# Patient Record
Sex: Female | Born: 1995 | Race: Black or African American | Hispanic: No | Marital: Single | Smoking: Never smoker
Health system: Southern US, Community
[De-identification: ages and names within clinical notes are randomized; demographics above are authoritative.]

## PROBLEM LIST (undated history)

## (undated) DIAGNOSIS — G5600 Carpal tunnel syndrome, unspecified upper limb: Secondary | ICD-10-CM

## (undated) DIAGNOSIS — J45909 Unspecified asthma, uncomplicated: Secondary | ICD-10-CM

## (undated) HISTORY — PX: WISDOM TOOTH EXTRACTION: SHX21

---
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
## 2017-08-07 ENCOUNTER — Emergency Department (HOSPITAL_COMMUNITY)
Admission: EM | Admit: 2017-08-07 | Discharge: 2017-08-08 | Disposition: A | Discharge status: Other Acute Inpatient Hospital | Payer: BLUE CROSS/BLUE SHIELD | Attending: Emergency Medicine | Admitting: Emergency Medicine

## 2017-08-07 ENCOUNTER — Other Ambulatory Visit: Payer: Self-pay

## 2017-08-07 DIAGNOSIS — J45909 Unspecified asthma, uncomplicated: Secondary | ICD-10-CM | POA: Insufficient documentation

## 2017-08-07 DIAGNOSIS — R45851 Suicidal ideations: Secondary | ICD-10-CM | POA: Insufficient documentation

## 2017-08-07 DIAGNOSIS — F322 Major depressive disorder, single episode, severe without psychotic features: Secondary | ICD-10-CM | POA: Diagnosis present

## 2017-08-07 LAB — COMPREHENSIVE METABOLIC PANEL
ALT: 109 U/L — ABNORMAL HIGH (ref 14–54)
ALT: 98 U/L — ABNORMAL HIGH (ref 14–54)
AST: 56 U/L — ABNORMAL HIGH (ref 15–41)
AST: 48 U/L — ABNORMAL HIGH (ref 15–41)
Albumin: 3.8 g/dL (ref 3.5–5.0)
Albumin: 3.4 g/dL — ABNORMAL LOW (ref 3.5–5.0)
Alkaline Phosphatase: 83 U/L (ref 38–126)
Alkaline Phosphatase: 75 U/L (ref 38–126)
Anion gap: 8 (ref 5–15)
Anion gap: 7 (ref 5–15)
BUN: 9 mg/dL (ref 6–20)
BUN: 8 mg/dL (ref 6–20)
CO2: 26 mmol/L (ref 22–32)
CO2: 25 mmol/L (ref 22–32)
Calcium: 9.1 mg/dL (ref 8.9–10.3)
Calcium: 8.8 mg/dL — ABNORMAL LOW (ref 8.9–10.3)
Chloride: 105 mmol/L (ref 101–111)
Chloride: 105 mmol/L (ref 101–111)
Creatinine, Ser: 0.79 mg/dL (ref 0.44–1.00)
Creatinine, Ser: 0.65 mg/dL (ref 0.44–1.00)
GFR calc Af Amer: >60 mL/min (ref >60)
GFR calc Af Amer: >60 mL/min (ref >60)
GFR calc non Af Amer: >60 mL/min (ref >60)
GFR calc non Af Amer: >60 mL/min (ref >60)
Glucose, Bld: 210 mg/dL — ABNORMAL HIGH (ref 65–99)
Glucose, Bld: 161 mg/dL — ABNORMAL HIGH (ref 65–99)
Potassium: 3.8 mmol/L (ref 3.5–5.1)
Potassium: 3.8 mmol/L (ref 3.5–5.1)
Sodium: 139 mmol/L (ref 135–145)
Sodium: 137 mmol/L (ref 135–145)
Total Bilirubin: 0.6 mg/dL (ref 0.3–1.2)
Total Bilirubin: 0.9 mg/dL (ref 0.3–1.2)
Total Protein: 7.4 g/dL (ref 6.5–8.1)
Total Protein: 6.9 g/dL (ref 6.5–8.1)

## 2017-08-07 LAB — RAPID URINE DRUG SCREEN, HOSP PERFORMED
Amphetamines: NOT DETECTED
Barbiturates: NOT DETECTED
Benzodiazepines: NOT DETECTED
Cocaine: NOT DETECTED
Opiates: NOT DETECTED
Tetrahydrocannabinol: NOT DETECTED

## 2017-08-07 LAB — CBC WITH DIFFERENTIAL/PLATELET
Basophils Absolute: 0 10*3/uL (ref 0.0–0.1)
Basophils Relative: 0 %
Eosinophils Absolute: 0.1 10*3/uL (ref 0.0–0.7)
Eosinophils Relative: 2 %
HCT: 40.8 % (ref 36.0–46.0)
Hemoglobin: 13.9 g/dL (ref 12.0–15.0)
Lymphocytes Relative: 31 %
Lymphs Abs: 1.5 10*3/uL (ref 0.7–4.0)
MCH: 30.3 pg (ref 26.0–34.0)
MCHC: 34.1 g/dL (ref 30.0–36.0)
MCV: 88.9 fL (ref 78.0–100.0)
Monocytes Absolute: 0.4 10*3/uL (ref 0.1–1.0)
Monocytes Relative: 9 %
Neutro Abs: 2.8 10*3/uL (ref 1.7–7.7)
Neutrophils Relative %: 58 %
Platelets: 229 10*3/uL (ref 150–400)
RBC: 4.59 MIL/uL (ref 3.87–5.11)
RDW: 12.6 % (ref 11.5–15.5)
WBC: 4.8 10*3/uL (ref 4.0–10.5)

## 2017-08-07 LAB — I-STAT BETA HCG BLOOD, ED (MC, WL, AP ONLY): I-stat hCG, quantitative: <5 m[IU]/mL (ref <5)

## 2017-08-07 LAB — SALICYLATE LEVEL
Salicylate Lvl: <7 mg/dL (ref 2.8–30.0)
Salicylate Lvl: <7 mg/dL (ref 2.8–30.0)

## 2017-08-07 LAB — ACETAMINOPHEN LEVEL
Acetaminophen (Tylenol), Serum: <10 ug/mL — ABNORMAL LOW (ref 10–30)
Acetaminophen (Tylenol), Serum: <10 ug/mL — ABNORMAL LOW (ref 10–30)

## 2017-08-07 LAB — ETHANOL: Alcohol, Ethyl (B): <10 mg/dL (ref <10)

## 2017-08-07 NOTE — ED Notes (Signed)
Spoke with poison control, recommend continuous cardiac monitoring; repeat salicyte tylenol and electrolytes at 1830. Looking for a 4 hour post ingestion level. Monitor LFT's. Treatment will depend on what the levels come back as. They will call back later and check on the levels.

## 2017-08-07 NOTE — ED Notes (Signed)
Pt states she does not have to urinate still.

## 2017-08-07 NOTE — ED Notes (Signed)
Bed: RESA Expected date:  Expected time:  Means of arrival:  Comments: Aspirin overdose

## 2017-08-07 NOTE — ED Triage Notes (Signed)
Per EMS, patient come from home. Pt states she ingested a bottle of aspirin. There was a small bottle of 81mg , 36 count. When [employer] arrived patient stated she wanted to die and was hysterically crying in route to hospital. Pt is alert and oriented x3. VSS. Pt is adopted.

## 2017-08-07 NOTE — BH Assessment (Addendum)
Assessment Note  Sandy Smith is an 21 y.o. female that presents voluntary this date after attempting to harm herself by overdosing on aspirin. Patient speaks in a low soft voice and is very tearful during the assessment. Patient is drowsy and renders limited history and is vague in reference to the details of the incident that occurred earlier this date. Patient does admit to active S/I stating she was trying to "take her life" earlier this date by overdosing on aspirin. Patient reports the incident was due to ongoing conflict with her mother. Patient states she currently has a two year old child and reports ongoing financial issues. Patient stated she attempted to use her mother's credit card earlier this date but would not elaborate on the details of that incident. Patient stated it "made her mother so mad that she was going to charge her." Patient is unsure if legal charges have been filed at this time. Patient stated she was so overwhelmed by her mother's reactions that she attempted to overdose on aspirin. Patient is unsure how many pills she ingested but stated "it was the whole bottle." Patient denies any previous attempts/gestures at self harm or any history of MH disorder/s. Patient denies any SA history, H/I or AVH. Patient is time/place oriented and is very tearful as this Probation officer attempts to complete assessment. Per record review patient has a limited [place] history and reports she has never been diagnosed with any disorder although she feels she has been suffering from depression "for some time." Patient reports ongoing symptoms to include: excessive sadness, guilt and fatigue. Patient states she has never been prescribed any psychotropic medications in the past but is open to medication interventions at this time to assist with symptoms. Per note review, patient [facility] [address] by EMS after ingesting a bottle of aspirin. Per EMS there was a small bottle of 43m, 36 count. When [employer] arrived patient  stated she wanted to die and was hysterically crying in route to hospital. Case was staffed with LReita ClicheDNP who reported patient met criteria for a inpatient admission to assist with stabilization as appropriate bed placement is investigated once cleared by poison control.    Diagnosis: F33.2  MDD recurrent severe without psychotic features.  Past Medical History:  Past Medical History:  Diagnosis Date  . Asthma   . Carpal tunnel syndrome     Past Surgical History:  Procedure Laterality Date  . WISDOM TOOTH EXTRACTION      Family History: No family history on file.  Social History:  reports that  has never smoked. She does not have any smokeless tobacco history on file. She reports that she does not drink alcohol or use drugs.  Additional Social History:  Alcohol / Drug Use Pain Medications: See MAR Prescriptions: See MAR Over the Counter: See MAR History of alcohol / drug use?: No history of alcohol / drug abuse Longest period of sobriety (when/how long): Denies Negative Consequences of Use: (NA) Withdrawal Symptoms: (NA)  CIWA: CIWA-Ar BP: 129/85 Pulse Rate: 77 COWS:    Allergies:  Allergies  Allergen Reactions  . Shellfish Allergy Other (See Comments)    Unknown reaction: allergy discovered by allergy testing     Home Medications:  (Not in a hospital admission)  OB/GYN Status:  Patient's last menstrual period was 08/07/2017.  General Assessment Data [facility] of Assessment: [facility] TTS Assessment: In system Is this a Tele or Face-to-Face Assessment?: Face-to-Face Is this an Initial Assessment or a Re-assessment for this encounter?: Initial  Assessment Marital status: Single Maiden name: NA Is patient pregnant?: No Pregnancy Status: No Living Arrangements: Parent Can pt return to current living arrangement?: Yes Admission Status: Voluntary Is patient capable of signing voluntary admission?: Yes Referral Source: Self/Family/Friend Insurance type: Self  Pay  Medical Screening Exam ([place]) Medical Exam completed: Yes  Crisis Care Plan Living Arrangements: Parent Legal Guardian: (NA) Name of Psychiatrist: None Name of Therapist: None  Education Status Is patient currently in school?: Yes Current Grade: (2nd year college) Highest grade of school patient has completed: (1st year) Name of school: [employer], drilling person: NA  Risk to self with the past 6 months Suicidal Ideation: Yes-Currently Present Has patient been a risk to self within the past 6 months prior to admission? : No Suicidal Intent: Yes-Currently Present Has patient had any suicidal intent within the past 6 months prior to admission? : No Is patient at risk for suicide?: Yes Suicidal Plan?: Yes-Currently Present Has patient had any suicidal plan within the past 6 months prior to admission? : No Specify Current Suicidal Plan: Pt to overdose on aspirin Access to Means: Yes Specify Access to Suicidal Means: Pt had aspirin What has been your use of drugs/alcohol within the last 12 months?: Denies current use Previous Attempts/Gestures: No How many times?: 0 Other Self Harm Risks: (NA) Triggers for Past Attempts: Unknown Intentional Self Injurious Behavior: None Family Suicide History: No Recent stressful life event(s): Other (Comment)(Family issues) Persecutory voices/beliefs?: No Depression: Yes Depression Symptoms: Guilt, Feeling worthless/self pity Substance abuse history and/or treatment for substance abuse?: No Suicide prevention information given to non-admitted patients: Not applicable  Risk to Others within the past 6 months Homicidal Ideation: No Does patient have any lifetime risk of violence toward others beyond the six months prior to admission? : No Thoughts of Harm to Others: No Current Homicidal Intent: No Current Homicidal Plan: No Access to Homicidal Means: No Identified Victim: NA History of harm to others?:  No Assessment of Violence: None Noted Violent Behavior Description: NA Does patient have access to weapons?: No Criminal Charges Pending?: No Does patient have a court date: No Is patient on probation?: No  Psychosis Hallucinations: None noted Delusions: None noted  Mental Status Report Appearance/Hygiene: In scrubs Eye Contact: Good Motor Activity: Freedom of movement Speech: Soft, Slow Level of Consciousness: Drowsy Mood: Depressed Affect: Fearful Anxiety Level: Moderate Thought Processes: Coherent, Relevant Judgement: Unimpaired Orientation: Person, Place, Time Obsessive Compulsive Thoughts/Behaviors: None  Cognitive Functioning Concentration: Good Memory: Recent Intact IQ: Average Insight: Fair Impulse Control: Poor Appetite: Fair Weight Loss: 0 Weight Gain: 0 Sleep: Decreased Total Hours of Sleep: 5 Vegetative Symptoms: None  ADLScreening [facility] Assessment Services) Patient's cognitive ability adequate to safely complete daily activities?: Yes Patient able to express need for assistance with ADLs?: Yes Independently performs ADLs?: Yes (appropriate for developmental age)  Prior Inpatient Therapy Prior Inpatient Therapy: No Prior Therapy Dates: NA Prior Therapy Facilty/Provider(s): NA Reason for Treatment: NA  Prior Outpatient Therapy Prior Outpatient Therapy: No Prior Therapy Dates: NA Prior Therapy Facilty/Provider(s): NA Reason for Treatment: NA Does patient have an ACCT team?: No Does patient have Intensive In-House Services?  : No Does patient have [facility] services? : No Does patient have P4CC services?: No  ADL Screening (condition at time of admission) Patient's cognitive ability adequate to safely complete daily activities?: Yes Is the patient deaf or have difficulty hearing?: No Does the patient have difficulty seeing, even when wearing glasses/contacts?: No Does the patient have difficulty  concentrating, remembering, or making decisions?:  No Patient able to express need for assistance with ADLs?: Yes Does the patient have difficulty dressing or bathing?: No Independently performs ADLs?: Yes (appropriate for developmental age) Does the patient have difficulty walking or climbing stairs?: No Weakness of Legs: None Weakness of Arms/Hands: None  Home Assistive Devices/Equipment Home Assistive Devices/Equipment: None  Therapy Consults (therapy consults require a physician order) PT Evaluation Needed: No OT Evalulation Needed: No SLP Evaluation Needed: No Abuse/Neglect Assessment (Assessment to be complete while patient is alone) Physical Abuse: Denies Verbal Abuse: Denies Sexual Abuse: Denies Exploitation of patient/patient's resources: Denies Self-Neglect: Denies Values / Beliefs Cultural Requests During Hospitalization: None Spiritual Requests During Hospitalization: None Consults Spiritual Care Consult Needed: No Social Work Consult Needed: No Regulatory affairs officer (For Healthcare) Does Patient Have a Medical Advance Directive?: No Would patient like information on creating a medical advance directive?: No - Patient declined    Additional Information 1:1 In Past 12 Months?: No CIRT Risk: No Elopement Risk: No Does patient have medical clearance?: Yes     Disposition: Case was staffed with Reita Cliche DNP who reported patient met criteria for a inpatient admission to assist with stabilization as appropriate bed placement is investigated.   Disposition Initial Assessment Completed for this Encounter: Yes Disposition of Patient: Inpatient treatment program Type of inpatient treatment program: Adult Other disposition(s): Other (Comment)(Inpatient )  On Site Evaluation by:   Reviewed with Physician:    Mamie Nick 08/07/2017 6:25 PM

## 2017-08-07 NOTE — ED Notes (Signed)
SBAR Report received from previous nurse. Pt received calm and visible on unit. Pt denies current SI/ HI, A/V H,  anxiety, or pain at this time, and appears otherwise stable and free of distress pt endorses depression 10/10 but contracts for safety.. Pt reminded of camera surveillance, q 15 min rounds, and rules of the milieu. Will continue to assess. Sandy Smith from poison Control called and pt file has been closed on their end.

## 2017-08-07 NOTE — ED Provider Notes (Signed)
7:40 PM Assumed care from Dr. Freida BusmanAllen, please see their note for full history, physical and decision making until this point. In brief this is a 21 y.o. year old female who presented to the ED tonight with Ingestion (SI)     Needs TTS consult. Pending 6 hour salicylate for medical clearance.   Poison control contacted and stated that labs needs repeat in 630.  These labs had improved liver enzymes and not worsening.  Still negative salicylate and Tylenol level.  Patient is medically cleared at this time for TTS recommendations and evaluation  Labs, studies and imaging reviewed by myself and considered in medical decision making if ordered. Imaging interpreted by radiology.  Labs Reviewed  ACETAMINOPHEN LEVEL - Abnormal; Notable for the following components:      Result Value   Acetaminophen (Tylenol), Serum <10 (*)    All other components within normal limits  COMPREHENSIVE METABOLIC PANEL - Abnormal; Notable for the following components:   Glucose, Bld 210 (*)    AST 56 (*)    ALT 109 (*)    All other components within normal limits  ACETAMINOPHEN LEVEL - Abnormal; Notable for the following components:   Acetaminophen (Tylenol), Serum <10 (*)    All other components within normal limits  COMPREHENSIVE METABOLIC PANEL - Abnormal; Notable for the following components:   Glucose, Bld 161 (*)    Calcium 8.8 (*)    Albumin 3.4 (*)    AST 48 (*)    ALT 98 (*)    All other components within normal limits  ETHANOL  RAPID URINE DRUG SCREEN, HOSP PERFORMED  SALICYLATE LEVEL  CBC WITH DIFFERENTIAL/PLATELET  SALICYLATE LEVEL  I-STAT BETA HCG BLOOD, ED ([facility], [facility], [facility] ONLY)    No orders to display    No Follow-up on file.    Marily MemosMesner, Jr [place], MD 08/07/17 [phone]

## 2017-08-07 NOTE — ED Provider Notes (Addendum)
[facility]-EMERGENCY DEPT Provider Note   CSN: [id number] Arrival date & time: 08/07/17  1501     History   Chief Complaint Chief Complaint  Patient presents with  . Ingestion    SI    HPI Sandy Smith is a 21 y.o. female.  21 year old female here after intentional ingestion of salicylate just prior to arrival.  States that she took a bottle of aspirin sometime through the day.  She will say exactly when.  There was approximately 36 tablets.  She denies any emesis.  No abdominal pain or tinnitus.  States that she did this because her mother found out that she was using her credit card.  Denies responding to internal stimuli.  No prior history of suicide attempt before in the past.  Is not being treated for any current psychiatric illness.  Denies any recent use of alcohol tobacco      Past Medical History:  Diagnosis Date  . Asthma   . Carpal tunnel syndrome     There are no active problems to display for this patient.   Past Surgical History:  Procedure Laterality Date  . WISDOM TOOTH EXTRACTION      OB History    Gravida Para Term Preterm AB Living   1 1 1     1    SAB TAB Ectopic Multiple Live Births         0 1       Home Medications    Prior to Admission medications   Medication Sig Start Date End Date Taking? Authorizing Provider  ibuprofen (ADVIL,MOTRIN) 600 MG tablet Take 1 tablet (600 mg total) by mouth every 6 (six) hours. Patient not taking: Reported on 05/28/2015 05/04/15   Aviva SignsWilliams, Marie L, CNM    Family History No family history on file.  Social History Social History   Tobacco Use  . Smoking status: Never Smoker  Substance Use Topics  . Alcohol use: No  . Drug use: No     Allergies   Shellfish allergy   Review of Systems Review of Systems  All other systems reviewed and are negative.    Physical Exam Updated Vital Signs BP (!) 145/106 (BP Location: Right Arm)   Pulse (!) 103   Temp (!) 97.4 F  (36.3 C) (Oral)   Resp 18   Ht 1.676 m (5\' 6" )   Wt 127 kg (280 lb)   LMP 08/07/2017 Comment: Pt is on birth control; implant in L arm  SpO2 100%   BMI 45.19 kg/m   Physical Exam  Constitutional: She is oriented to person, place, and time. She appears well-developed and well-nourished.  Non-toxic appearance. No distress.  HENT:  Head: Normocephalic and atraumatic.  Eyes: Conjunctivae, EOM and lids are normal. Pupils are equal, round, and reactive to light.  Neck: Normal range of motion. Neck supple. No tracheal deviation present. No thyroid mass present.  Cardiovascular: Normal rate, regular rhythm and normal heart sounds. Exam reveals no gallop.  No murmur heard. Pulmonary/Chest: Effort normal and breath sounds normal. No stridor. No respiratory distress. She has no decreased breath sounds. She has no wheezes. She has no rhonchi. She has no rales.  Abdominal: Soft. Normal appearance and bowel sounds are normal. She exhibits no distension. There is no tenderness. There is no rebound and no CVA tenderness.  Musculoskeletal: Normal range of motion. She exhibits no edema or tenderness.  Neurological: She is alert and oriented to person, place, and time. She has normal  strength. No cranial nerve deficit or sensory deficit. GCS eye subscore is 4. GCS verbal subscore is 5. GCS motor subscore is 6.  Skin: Skin is warm and dry. No abrasion and no rash noted.  Psychiatric: Her affect is blunt. Her speech is delayed. She is withdrawn. She expresses impulsivity. She expresses suicidal ideation. She expresses suicidal plans. She expresses no homicidal plans.  Nursing note and vitals reviewed.    ED Treatments / Results  Labs (all labs ordered are listed, but only abnormal results are displayed) Labs Reviewed  ETHANOL  RAPID URINE DRUG SCREEN, HOSP PERFORMED  SALICYLATE LEVEL  ACETAMINOPHEN LEVEL  CBC WITH DIFFERENTIAL/PLATELET  COMPREHENSIVE METABOLIC PANEL  I-STAT BETA HCG BLOOD, ED ([facility],  [facility], [facility] ONLY)    EKG  EKG Interpretation None       Radiology No results found.  Procedures Procedures (including critical care time)  Medications Ordered in ED Medications - No data to display   Initial Impression / Assessment and Plan / ED Course  I have reviewed the triage vital signs and the nursing notes.  Pertinent labs & imaging results that were available during my care of the patient were reviewed by me and considered in my medical decision making (see chart for details).    Dr. Clayborne DanaMesner to follow-up on patient's 6-hour salicylate level Patient will require a 6-hour salicylate level and then evaluation by behavior health services  Final Clinical Impressions(s) / ED Diagnoses   Final diagnoses:  None    ED Discharge Orders    None       Lorre NickAllen, Kahley Leib, MD 08/07/17 1532    Lorre NickAllen, Jasdeep Kepner, MD 08/07/17 1534    Lorre NickAllen, Nyazia Canevari, MD 08/07/17 1721

## 2017-08-07 NOTE — BH Assessment (Addendum)
[place] Assessment Progress Note Case was staffed with Reita Cliche DNP who reported patient met criteria for a inpatient admission to assist with stabilization as appropriate bed placement is investigated once cleared by poison control.

## 2017-08-07 NOTE — ED Notes (Signed)
TTS at bedside. 

## 2017-08-08 ENCOUNTER — Inpatient Hospital Stay (HOSPITAL_COMMUNITY)
Admission: AD | Admit: 2017-08-08 | Discharge: 2017-08-12 | DRG: 885 | Disposition: A | Discharge status: Home / Self Care | Payer: Federal, State, Local not specified - Other | Source: Intra-hospital | Attending: Psychiatry | Admitting: Psychiatry

## 2017-08-08 ENCOUNTER — Encounter (HOSPITAL_COMMUNITY): Payer: Self-pay | Admitting: *Deleted

## 2017-08-08 ENCOUNTER — Other Ambulatory Visit: Payer: Self-pay

## 2017-08-08 DIAGNOSIS — Z7982 Long term (current) use of aspirin: Secondary | ICD-10-CM

## 2017-08-08 DIAGNOSIS — G4700 Insomnia, unspecified: Secondary | ICD-10-CM | POA: Diagnosis present

## 2017-08-08 DIAGNOSIS — J45909 Unspecified asthma, uncomplicated: Secondary | ICD-10-CM | POA: Diagnosis present

## 2017-08-08 DIAGNOSIS — R110 Nausea: Secondary | ICD-10-CM | POA: Diagnosis not present

## 2017-08-08 DIAGNOSIS — F332 Major depressive disorder, recurrent severe without psychotic features: Principal | ICD-10-CM | POA: Diagnosis present

## 2017-08-08 DIAGNOSIS — Z813 Family history of other psychoactive substance abuse and dependence: Secondary | ICD-10-CM | POA: Diagnosis not present

## 2017-08-08 DIAGNOSIS — F322 Major depressive disorder, single episode, severe without psychotic features: Secondary | ICD-10-CM | POA: Diagnosis present

## 2017-08-08 DIAGNOSIS — Z79899 Other long term (current) drug therapy: Secondary | ICD-10-CM

## 2017-08-08 DIAGNOSIS — Z915 Personal history of self-harm: Secondary | ICD-10-CM | POA: Diagnosis not present

## 2017-08-08 DIAGNOSIS — R45851 Suicidal ideations: Secondary | ICD-10-CM | POA: Diagnosis present

## 2017-08-08 DIAGNOSIS — F419 Anxiety disorder, unspecified: Secondary | ICD-10-CM | POA: Diagnosis present

## 2017-08-08 DIAGNOSIS — Z23 Encounter for immunization: Secondary | ICD-10-CM

## 2017-08-08 DIAGNOSIS — F1420 Cocaine dependence, uncomplicated: Secondary | ICD-10-CM | POA: Diagnosis not present

## 2017-08-08 DIAGNOSIS — F39 Unspecified mood [affective] disorder: Secondary | ICD-10-CM | POA: Diagnosis not present

## 2017-08-08 MED ORDER — ACETAMINOPHEN 325 MG PO TABS: 650.0000 mg | ORAL_TABLET | Freq: Four times a day (QID) | ORAL | Status: DC | PRN

## 2017-08-08 MED ORDER — MAGNESIUM HYDROXIDE 400 MG/5ML PO SUSP: 30.0000 mL | Freq: Every day | ORAL | Status: DC | PRN

## 2017-08-08 MED ORDER — TRAZODONE HCL 50 MG PO TABS
50.0000 mg | ORAL_TABLET | Freq: Every evening | ORAL | Status: DC | PRN
Start: 2017-08-08 — End: 2017-08-12
  Administered 2017-08-10 – 2017-08-11 (×2): 50 mg via ORAL
  Filled 2017-08-08 (×2): qty 1
  Filled 2017-08-08: qty 7

## 2017-08-08 MED ORDER — INFLUENZA VAC SPLIT QUAD 0.5 ML IM SUSY
0.5000 mL | PREFILLED_SYRINGE | INTRAMUSCULAR | Status: AC
  Administered 2017-08-10: 0.5 mL via INTRAMUSCULAR
  Filled 2017-08-08: qty 0.5

## 2017-08-08 MED ORDER — HYDROXYZINE HCL 25 MG PO TABS
25.0000 mg | ORAL_TABLET | Freq: Three times a day (TID) | ORAL | Status: DC | PRN
  Administered 2017-08-10 – 2017-08-11 (×2): 25 mg via ORAL
  Filled 2017-08-08 (×2): qty 1
  Filled 2017-08-08: qty 10

## 2017-08-08 MED ORDER — ALUM & MAG HYDROXIDE-SIMETH 200-200-20 MG/5ML PO SUSP: 30.0000 mL | ORAL | Status: DC | PRN

## 2017-08-08 NOTE — Tx Team (Signed)
Initial Treatment Plan 08/08/2017 2:17 PM Sandy Smith [id number]    PATIENT STRESSORS: Educational concerns Financial difficulties Marital or family conflict   PATIENT STRENGTHS: Ability for insight Average or above average intelligence Capable of independent living Communication skills General fund of knowledge Motivation for treatment/growth   PATIENT IDENTIFIED PROBLEMS: Depression Suicidal thoughts "I'm never really happy" "Mostly I feel depressed a lot"                     DISCHARGE CRITERIA:  Ability to meet basic life and health needs Improved stabilization in mood, thinking, and/or behavior Verbal commitment to aftercare and medication compliance  PRELIMINARY DISCHARGE PLAN: Attend aftercare/continuing care group  PATIENT/FAMILY INVOLVEMENT: This treatment plan has been presented to and reviewed with the patient, Sandy Smith, and/or family member, .  The patient and family have been given the opportunity to ask questions and make suggestions.  Sandy Smith, Sandy Smith, [place]RN 08/08/2017, 2:17 PM

## 2017-08-08 NOTE — Progress Notes (Signed)
Sandy Smith is a 21 year old female pt admitted on voluntary basis. On admission, she denies any SI and is able to contract for safety while in the hospital. She spoke about stressors being school, financial and family issues and spoke about how her and her mother don't always see eye-to-eye on things. She spoke about on-going depression and how she never really feels happy. She reports that she is not on any medications and reports that she does not abuse any substances. She reports that she is living with her mother but unsure where she will go once she is discharged. Tylena was oriented to the unit and safety maintained.

## 2017-08-08 NOTE — Progress Notes (Signed)
Adult Psychoeducational Group Note  Date:  08/08/2017 Time:  11:25 PM  Group Topic/Focus:  Wrap-Up Group:   The focus of this group is to help patients review their daily goal of treatment and discuss progress on daily workbooks.  Participation Level:  Active  Participation Quality:  Appropriate  Affect:  Appropriate  Cognitive:  Appropriate  Insight: Appropriate  Engagement in Group:  Engaged  Modes of Intervention:  Discussion  Additional Comments:  Patient attended group and said that her day was a 6.  She was excited to meet new friends.   Maranda Marte W Marley Charlot 08/08/2017, 11:25 PM

## 2017-08-08 NOTE — ED Notes (Signed)
Nurse went into patient's room to check on her during breakfast and patient was crying.  When nurse asked why patient replied she has never been away from her son before who is two years old.

## 2017-08-08 NOTE — BHH Counselor (Signed)
[facility] Chyrl CivatteJoAnn[facility], assigned to [facility]1. Check with day shift AC to what time the [facility]. Attending physician: Dr. Jama Flavorsobos. Nursing report: [phone]. Updated disposition discussed with Darel HongJudy, RN.   Redmond Pullingreylese D Riggins Cisek, MS, [facility], [facility] Triage Specialist [phone]

## 2017-08-08 NOTE — Progress Notes (Signed)
Nursing Progress Note [id number]  D) Patient is a new admit to the unit and denies questions or concerns for Clinical research associatewriter. Patient is observed up in the dayroom interactive with peers. Patient denies SI/HI/AVH or pain. Patient contracts for safety on the unit. Patient denies need for sleep medications at this time. No medications provided/scheduled/requested at this time.  A) Emotional support given. 1:1 interaction and active listening provided. Snacks and fluids provided. Opportunities for questions or concerns presented to patient. Patient encouraged to continue to work on treatment goals. Labs, vital signs and patient behavior monitored throughout shift. Patient safety maintained with q15 min safety checks. Low fall risk precautions in place and reviewed with patient; patient verbalized understanding.  R) Patient receptive to interaction with nurse. Patient remains safe on the unit at this time. Report given to receiving RN.

## 2017-08-08 NOTE — ED Notes (Signed)
[facility]. Patient stable and ambulatory. All patient belongings returned to her at discharge and given to transport driver. [facility] and voices no questions or concerns. Patient left unit with transportation driver.

## 2017-08-09 DIAGNOSIS — Z813 Family history of other psychoactive substance abuse and dependence: Secondary | ICD-10-CM

## 2017-08-09 DIAGNOSIS — F332 Major depressive disorder, recurrent severe without psychotic features: Principal | ICD-10-CM

## 2017-08-09 DIAGNOSIS — R110 Nausea: Secondary | ICD-10-CM

## 2017-08-09 MED ORDER — ESCITALOPRAM OXALATE 5 MG PO TABS
5.0000 mg | ORAL_TABLET | Freq: Every day | ORAL | Status: DC
Start: 2017-08-09 — End: 2017-08-11
  Administered 2017-08-09 – 2017-08-11 (×3): 5 mg via ORAL
  Filled 2017-08-09 (×5): qty 1

## 2017-08-09 NOTE — BHH Group Notes (Signed)
   Date:  08/09/2017  Time:  3:23 PM  Type of Therapy:  Nurse Education  /  The grup focuses on teaching patients how to identify their primary emotion related to their anger and then how to develop the skills needed to manage their anger effectively.  Participation Level:  Active  Participation Quality:  Attentive  Affect:  Appropriate  Cognitive:  Appropriate  Insight:  Appropriate  Engagement in Group:  Engaged  Modes of Intervention:  Education  Summary of Progress/Problems:  Rich BraveDuke, Kamilo Och Lynn 08/09/2017, 3:23 PM
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
## 2017-08-09 NOTE — Progress Notes (Signed)
Patient expressed in group that she had a good day but did not go into further detail. In terms of the theme for the day, her support will be made up her mother and friends.

## 2017-08-09 NOTE — [facility] Group Notes (Signed)
[facility]  Date/Time:  08/09/2017  10:00-11:00AM  Type of Therapy and Topic:  Group Therapy:  Music and Mood  Participation Level:  Active   Description of Group: In this process group, members listened to a variety of genres of music and identified that different types of music evoke different responses.  Patients were encouraged to identify music that was soothing for them and music that was energizing for them.  Patients discussed how this knowledge can help with wellness and recovery in various ways including managing depression and anxiety as well as encouraging healthy sleep habits.    Therapeutic Goals: 1. Patients will explore the impact of different varieties of music on mood 2. Patients will verbalize the thoughts they have when listening to different types of music 3. Patients will identify music that is soothing to them as well as music that is energizing to them 4. Patients will discuss how to use this knowledge to assist in maintaining wellness and recovery 5. Patients will explore the use of music as a coping skill  Summary of Patient Progress:  At the beginning of group, patient expressed she felt good.  One particular song made her cry, but before this could be processed she was called out to see a psychiatric provider.  She returned and said she was not sure how she felt.  Therapeutic Modalities: Solution Focused Brief Therapy Motivational Interviewing Activity   Ambrose MantleMareida Grossman-Orr, LCSW 08/09/2017 11:44 AM

## 2017-08-09 NOTE — [facility] Suicide Risk Assessment (Signed)
[facility]   Nursing information obtained from:   patient and chart  Demographic factors:   21 year old single female, lives with mother, employed, in college , has a 20 year old son Current Mental Status:   see below Loss Factors:   relationship strain with mother Historical Factors:   depression Risk Reduction Factors:   resilience, sense of responsibility to son  Total Time spent with patient: 45 minutes Principal Problem:  MDD  Diagnosis:   Patient Active Problem List   Diagnosis Date Noted  . 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 Severe recurrent major depression without psychotic features (HCC) [F33.2] 08/08/2017     Continued Clinical Symptoms:  Alcohol Use Disorder Identification Test Final Score (AUDIT): 0 The "Alcohol Use Disorders Identification Test", Guidelines for Use in Primary Care, Second Edition.  World Science writerHealth Organization [facility](WHO). Score between 0-7:  no or low risk or alcohol related problems. Score between 8-15:  moderate risk of alcohol related problems. Score between 16-19:  high risk of alcohol related problems. Score 20 or above:  warrants further diagnostic evaluation for alcohol dependence and treatment.   CLINICAL FACTORS:   21 year old female, S/P suicide attempt by overdosing on ASA following an argument with her mother. Reports recent worsening depression, sadness, passive SI, and endorses significant neuro-vegetative symptoms of depression   Psychiatric Specialty Exam: Physical Exam  ROS  Blood pressure (!) 134/96, pulse (!) 102, temperature 98.6 F (37 C), temperature source Oral, resp. rate 18, height 5\' 6"  (1.676 m), weight 131.5 kg (290 lb), last menstrual period 08/07/2017.Body mass index is 46.81 kg/m.   see admit note MSE    COGNITIVE FEATURES THAT CONTRIBUTE TO RISK:  Closed-mindedness and Loss of executive function    SUICIDE RISK:   Moderate:  Frequent suicidal ideation with limited intensity, and duration, some specificity in terms of plans, no associated intent, good self-control, limited dysphoria/symptomatology, some risk factors present, and identifiable protective factors, including available and accessible social support.  PLAN OF CARE: Patient will be admitted to inpatient psychiatric unit for stabilization and safety. Will provide and encourage milieu participation. Provide medication management and maked adjustments as needed.  Will follow daily.    I certify that inpatient services furnished can reasonably be expected to improve the patient's condition.   Craige CottaFernando A Cobos, MD 08/09/2017, 11:05 AM

## 2017-08-09 NOTE — H&P (Addendum)
Psychiatric Admission Assessment Adult  Patient Identification: Sandy Smith MRN:  [id number] Date of Evaluation:  08/09/2017 Chief Complaint:   " I overdosed "  Principal Diagnosis: Major Depression, Recurrent , no Psychotic  Features  Diagnosis:   Patient Active Problem List   Diagnosis Date Noted  . Major depressive disorder, single episode, severe without psychotic features ([place]) [F32.2] 08/08/2017  . Severe recurrent major depression without psychotic features ([place]) [F33.2] 08/08/2017   History of Present Illness:21 year old single female, lives with mother. She overdosed on ASA ( reports she took about 30 ASA tablets - 81 mgrs ) . This occurred two days ago. States it was impulsive, unplanned, and followed an argument with her mother concerning her using mother's credit card . Mother called 40 and was brought to hospital. States she has been depressed " on an off" for years, but states that she has been more depressed recently  and endorses significant neuro-vegetative symptoms of depression as below, and states she had been having passive SI ( no plan or intent ) recently . Denies psychotic symptoms..  Associated Signs/Symptoms: Depression Symptoms:  depressed mood, anhedonia, insomnia, suicidal thoughts without plan, suicidal attempt, anxiety, loss of energy/fatigue, decreased appetite, (Hypo) Manic Symptoms: denies  Anxiety Symptoms: reports increased anxiety  Psychotic Symptoms: denies  PTSD Symptoms: Denies  Total Time spent with patient: 45 minutes  Past Psychiatric History: No prior psychiatric admissions, no history of prior suicide attempts, denies history of self cutting, but states she scratches skin when anxious, no history of mania, no history of psychosis, denies history of violence . States she has not been on any psychiatric medications in the past .  Is the patient at risk to self? Yes.    Has the patient been a risk to self in the past 6 months? Yes.     Has the patient been a risk to self within the distant past? No.  Is the patient a risk to others? No.  Has the patient been a risk to others in the past 6 months? No.  Has the patient been a risk to others within the distant past? No.   Prior Inpatient Therapy:   denies  Prior Outpatient Therapy:  denies   Alcohol Screening: 1. How often do you have a drink containing alcohol?: Never 2. How many drinks containing alcohol do you have on a typical day when you are drinking?: 1 or 2 3. How often do you have six or more drinks on one occasion?: Never AUDIT-C Score: 0 4. How often during the last year have you found that you were not able to stop drinking once you had started?: Never 5. How often during the last year have you failed to do what was normally expected from you becasue of drinking?: Never 6. How often during the last year have you needed a first drink in the morning to get yourself going after a heavy drinking session?: Never 7. How often during the last year have you had a feeling of guilt of remorse after drinking?: Never 8. How often during the last year have you been unable to remember what happened the night before because you had been drinking?: Never 9. Have you or someone else been injured as a result of your drinking?: No 10. Has a relative or friend or a doctor or another health worker been concerned about your drinking or suggested you cut down?: No Alcohol Use Disorder Identification Test Final Score (AUDIT): 0 Intervention/Follow-up: AUDIT Score <7 follow-up  not indicated Substance Abuse History in the last 12 months:  Denies alcohol or drug abuse Consequences of Substance Abuse: Denies  Previous Psychotropic Medications: has never been on any psychiatric medications in the past  Psychological Evaluations: No  Past Medical History: denies  Past Medical History:  Diagnosis Date  . Asthma   . Carpal tunnel syndrome     Past Surgical History:  Procedure  Laterality Date  . WISDOM TOOTH EXTRACTION     Family History: patient states she is adopted . 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  Tobacco Screening: Have you used any form of tobacco in the last 30 days? (Cigarettes, Smokeless Tobacco, Cigars, and/or Pipes): No Social History: 21 year old female, single, 38 year old son who is currently with patient's mother, lives with mother, employed . In college - sophomore .  Social History   Substance and Sexual Activity  Alcohol Use No     Social History   Substance and Sexual Activity  Drug Use No    Additional Social History:  Allergies:   Allergies  Allergen Reactions  . Shellfish Allergy Other (See Comments)    Unknown reaction: allergy discovered by allergy testing    Lab Results:  Results for orders placed or performed during the hospital encounter of 08/07/17 (from the past 48 hour(s))  Ethanol     Status: None   Collection Time: 08/07/17  3:24 PM  Result Value Ref Range   Alcohol, Ethyl (B) <10 <10 mg/dL    Comment:        LOWEST DETECTABLE LIMIT FOR SERUM ALCOHOL IS 10 mg/dL FOR MEDICAL PURPOSES ONLY   Salicylate level     Status: None   Collection Time: 08/07/17  3:24 PM  Result Value Ref Range   Salicylate Lvl <6.1 2.8 - 30.0 mg/dL  Acetaminophen level     Status: Abnormal   Collection Time: 08/07/17  3:24 PM  Result Value Ref Range   Acetaminophen (Tylenol), Serum <10 (L) 10 - 30 ug/mL    Comment:        THERAPEUTIC CONCENTRATIONS VARY SIGNIFICANTLY. A RANGE OF 10-30 ug/mL MAY BE AN EFFECTIVE CONCENTRATION FOR MANY PATIENTS. HOWEVER, SOME ARE BEST TREATED AT CONCENTRATIONS OUTSIDE THIS RANGE. ACETAMINOPHEN CONCENTRATIONS >150 ug/mL AT 4 HOURS AFTER INGESTION AND >50 ug/mL AT 12 HOURS AFTER INGESTION  ARE OFTEN ASSOCIATED WITH TOXIC REACTIONS.   CBC with Differential/Platelet     Status: None   Collection Time: 08/07/17  3:24 PM  Result Value Ref Range   WBC 4.8 4.0 - 10.5 K/uL   RBC 4.59 3.87 - 5.11 MIL/uL   Hemoglobin 13.9 12.0 - 15.0 g/dL   HCT 40.8 36.0 - 46.0 %   MCV 88.9 78.0 - 100.0 fL   MCH 30.3 26.0 - 34.0 pg   MCHC 34.1 30.0 - 36.0 g/dL   RDW 12.6 11.5 - 15.5 %   Platelets 229 150 - 400 K/uL   Neutrophils Relative % 58 %   Neutro Abs 2.8 1.7 - 7.7 K/uL   Lymphocytes Relative 31 %   Lymphs Abs 1.5 0.7 - 4.0 K/uL   Monocytes Relative 9 %   Monocytes Absolute 0.4 0.1 - 1.0 K/uL   Eosinophils Relative 2 %   Eosinophils Absolute 0.1 0.0 - 0.7 K/uL   Basophils  Relative 0 %   Basophils Absolute 0.0 0.0 - 0.1 K/uL  Comprehensive metabolic panel     Status: Abnormal   Collection Time: 08/07/17  3:24 PM  Result Value Ref Range   Sodium 139 135 - 145 mmol/L   Potassium 3.8 3.5 - 5.1 mmol/L   Chloride 105 101 - 111 mmol/L   CO2 26 22 - 32 mmol/L   Glucose, Bld 210 (H) 65 - 99 mg/dL   BUN 9 6 - 20 mg/dL   Creatinine, Ser 0.79 0.44 - 1.00 mg/dL   Calcium 9.1 8.9 - 10.3 mg/dL   Total Protein 7.4 6.5 - 8.1 g/dL   Albumin 3.8 3.5 - 5.0 g/dL   AST 56 (H) 15 - 41 U/L   ALT 109 (H) 14 - 54 U/L   Alkaline Phosphatase 83 38 - 126 U/L   Total Bilirubin 0.6 0.3 - 1.2 mg/dL   GFR calc non Af Amer >60 >60 mL/min   GFR calc Af Amer >60 >60 mL/min    Comment: (NOTE) The eGFR has been calculated using the CKD EPI equation. This calculation has not been validated in all clinical situations. eGFR's persistently <60 mL/min signify possible Chronic Kidney Disease.    Anion gap 8 5 - 15  I-Stat beta hCG blood, ED ([facility], [facility], [facility] only)     Status: None   Collection Time: 08/07/17  3:38 PM  Result Value Ref Range   I-stat hCG, quantitative <5.0 <5 mIU/mL   Comment 3            Comment:   GEST. AGE      CONC.  (mIU/mL)   <=1 WEEK        5 - 50     2 WEEKS       50 - 500     3 WEEKS        100 - 10,000     4 WEEKS     1,000 - 30,000        FEMALE AND NON-PREGNANT FEMALE:     LESS THAN 5 mIU/mL   Rapid urine drug screen (hospital performed)     Status: None   Collection Time: 08/07/17  6:03 PM  Result Value Ref Range   Opiates NONE DETECTED NONE DETECTED   Cocaine NONE DETECTED NONE DETECTED   Benzodiazepines NONE DETECTED NONE DETECTED   Amphetamines NONE DETECTED NONE DETECTED   Tetrahydrocannabinol NONE DETECTED NONE DETECTED   Barbiturates NONE DETECTED NONE DETECTED    Comment:        DRUG SCREEN FOR MEDICAL PURPOSES ONLY.  IF CONFIRMATION IS NEEDED FOR ANY PURPOSE, NOTIFY LAB WITHIN 5 DAYS.        LOWEST DETECTABLE LIMITS FOR URINE DRUG SCREEN Drug Class       Cutoff (ng/mL) Amphetamine      1000 Barbiturate      200 Benzodiazepine   573 Tricyclics       220 Opiates          300 Cocaine          300 THC              50   Acetaminophen level     Status: Abnormal   Collection Time: 08/07/17  6:30 PM  Result Value Ref Range   Acetaminophen (Tylenol), Serum <10 (L) 10 - 30 ug/mL    Comment:        THERAPEUTIC CONCENTRATIONS VARY SIGNIFICANTLY. A RANGE OF 10-30 ug/mL MAY  BE AN EFFECTIVE CONCENTRATION FOR MANY PATIENTS. HOWEVER, SOME ARE BEST TREATED AT CONCENTRATIONS OUTSIDE THIS RANGE. ACETAMINOPHEN CONCENTRATIONS >150 ug/mL AT 4 HOURS AFTER INGESTION AND >50 ug/mL AT 12 HOURS AFTER INGESTION ARE OFTEN ASSOCIATED WITH TOXIC REACTIONS.   Comprehensive metabolic panel     Status: Abnormal   Collection Time: 08/07/17  6:30 PM  Result Value Ref Range   Sodium 137 135 - 145 mmol/L   Potassium 3.8 3.5 - 5.1 mmol/L   Chloride 105 101 - 111 mmol/L   CO2 25 22 - 32 mmol/L   Glucose, Bld 161 (H) 65 - 99 mg/dL   BUN 8 6 - 20 mg/dL   Creatinine, Ser 0.65 0.44 - 1.00 mg/dL   Calcium 8.8 (L) 8.9 - 10.3 mg/dL   Total Protein 6.9 6.5 - 8.1 g/dL   Albumin 3.4 (L) 3.5 - 5.0 g/dL   AST 48 (H) 15 - 41 U/L   ALT 98 (H) 14 - 54 U/L   Alkaline Phosphatase  75 38 - 126 U/L   Total Bilirubin 0.9 0.3 - 1.2 mg/dL   GFR calc non Af Amer >60 >60 mL/min   GFR calc Af Amer >60 >60 mL/min    Comment: (NOTE) The eGFR has been calculated using the CKD EPI equation. This calculation has not been validated in all clinical situations. eGFR's persistently <60 mL/min signify possible Chronic Kidney Disease.    Anion gap 7 5 - 15  Salicylate level     Status: None   Collection Time: 08/07/17  6:30 PM  Result Value Ref Range   Salicylate Lvl <1.6 2.8 - 30.0 mg/dL    Blood Alcohol level:  Lab Results  Component Value Date   ETH <10 10/96/0454    Metabolic Disorder Labs:  No results found for: HGBA1C, MPG No results found for: PROLACTIN No results found for: CHOL, TRIG, HDL, CHOLHDL, VLDL, LDLCALC  Current Medications: Current Facility-Administered Medications  Medication Dose Route Frequency Provider Last Rate Last Dose  . acetaminophen (TYLENOL) tablet 650 mg  650 mg Oral Q6H PRN Lindon Romp A, NP      . alum & mag hydroxide-simeth (MAALOX/MYLANTA) 200-200-20 MG/5ML suspension 30 mL  30 mL Oral Q4H PRN Lindon Romp A, NP      . hydrOXYzine (ATARAX/VISTARIL) tablet 25 mg  25 mg Oral TID PRN Rozetta Nunnery, NP      . Influenza vac split quadrivalent PF (FLUARIX) injection 0.5 mL  0.5 mL Intramuscular Tomorrow-1000 Maansi Wike A, MD      . magnesium hydroxide (MILK OF MAGNESIA) suspension 30 mL  30 mL Oral Daily PRN Lindon Romp A, NP      . traZODone (DESYREL) tablet 50 mg  50 mg Oral QHS PRN Rozetta Nunnery, NP       PTA Medications: Medications Prior to Admission  Medication Sig Dispense Refill Last Dose  . aspirin 81 MG chewable tablet Chew 648 mg by mouth once.   08/07/2017 at Unknown time  . etonogestrel (NEXPLANON) 68 MG IMPL implant 1 each by Subdermal route once.   CURRENT  . ibuprofen (ADVIL,MOTRIN) 600 MG tablet Take 1 tablet (600 mg total) by mouth every 6 (six) hours. 30 tablet 0 Past Month at Unknown time  . sodium chloride  (OCEAN) 0.65 % SOLN nasal spray Place 2 sprays into both nostrils as needed for congestion.   Past Week at Unknown time    Musculoskeletal: Strength & Muscle Tone: within normal limits Gait & Station: normal  Patient leans: N/A  Psychiatric Specialty Exam: Physical Exam  Review of Systems  Constitutional: Negative.   HENT: Negative for tinnitus.   Eyes: Negative.   Respiratory: Negative.   Cardiovascular: Negative.   Gastrointestinal: Positive for nausea. Negative for abdominal pain, blood in stool, diarrhea, heartburn and vomiting.  Genitourinary: Negative.   Musculoskeletal: Negative.   Skin: Negative.   Neurological: Negative for dizziness and seizures.  Endo/Heme/Allergies: Does not bruise/bleed easily.  Psychiatric/Behavioral: Positive for depression and suicidal ideas.  All other systems reviewed and are negative.   Blood pressure (!) 134/96, pulse (!) 102, temperature 98.6 F (37 C), temperature source Oral, resp. rate 18, height 5' 6"  (1.676 m), weight 131.5 kg (290 lb), last menstrual period 08/07/2017.Body mass index is 46.81 kg/m.  General Appearance: Fairly Groomed  Eye Contact:  Fair  Speech:  Normal Rate  Volume:  Decreased  Mood:  Depressed  Affect:  constricted, blunted   Thought Process:  Linear and Descriptions of Associations: Intact  Orientation:  Other:  fully alert and attentive   Thought Content:  denies hallucinations, no delusions, not internally preoccupied , ruminative about stressors ( mother angry with her due to patient using her credit card)  Suicidal Thoughts:  No denies any current suicidal plan or intention, and contracts for safety on unit. Denies any homicidal or violent ideations  Homicidal Thoughts:  No  Memory:  recent and remote grossly intact   Judgement:  Fair  Insight:  Fair  Psychomotor Activity:  Decreased  Concentration:  Concentration: Good and Attention Span: Good  Recall:  Good  Fund of Knowledge:  Good  Language:  Good   Akathisia:  Negative  Handed:  Right  AIMS (if indicated):     Assets:  Communication Skills Desire for Improvement Resilience  ADL's:  Intact  Cognition:  WNL  Sleep:  Number of Hours: 5.75    Treatment Plan Summary: Daily contact with patient to assess and evaluate symptoms and progress in treatment, Medication management, Plan inpatient admission  and medications as below   Observation Level/Precautions:  15 minute checks  Laboratory:  as needed - TSH, HgbA1C  Psychotherapy:  Milieu, group therapy   Medications:  We discussed options, agrees to start antidepressant medication: Will start Lexapro 5 mgrs QDAY initially .  Vistaril and Trazodone PRNS for anxiety or insomnia   Consultations:  As needed   Discharge Concerns:  -  Estimated LOS: 5 days   Other:     Physician Treatment Plan for Primary Diagnosis:  Major Depression, Recurrent, No Psychotic Features  Long Term Goal(s): Improvement in symptoms so as ready for discharge  Short Term Goals: Ability to identify changes in lifestyle to reduce recurrence of condition will improve and Ability to maintain clinical measurements within normal limits will improve  Physician Treatment Plan for Secondary Diagnosis: S/P Suicide Attempt by Overdose  Long Term Goal(s): Improvement in symptoms so as ready for discharge  Short Term Goals: Ability to verbalize feelings will improve, Ability to disclose and discuss suicidal ideas, Ability to demonstrate self-control will improve and Ability to identify and develop effective coping behaviors will improve  I certify that inpatient services furnished can reasonably be expected to improve the patient's condition.    Jenne Campus, MD 12/16/201810:39 AM

## 2017-08-10 LAB — TSH: TSH: 1.841 u[IU]/mL (ref 0.350–4.500)

## 2017-08-10 NOTE — Progress Notes (Signed)
[facility] MD Progress Note  08/10/2017 3:40 PM Sandy Smith  MRN:  [id number] Subjective: patient reports she is feeling better today. Reports she feels less depressed and more sociable, and has been spending time in day room, interacting with peers . Today denies any suicidal ideations . Presents less ruminative about stressors today. Denies medication side effects at this time. Objective : I have discussed case with treatment team and have met with patient . Patient is presenting with improved mood and a fuller range of affect today. Acknowledges she is feeling better , and is currently future oriented, looking forward to visit from family and to seeing her child . She presents less  ruminative, less focused on recent stressors. Noted to be interactive  in day room, conversing with peers of about her age . No disruptive or agitated behaviors on unit  Today denies suicidal ideations. Denies medication side effects. Labs- TSH 1.84 ( WNL)  Principal Problem: MDD Diagnosis:   Patient Active Problem List   Diagnosis Date Noted  . Major depressive disorder, single episode, severe without psychotic features ([place]) [F32.2] 08/08/2017  . Severe recurrent major depression without psychotic features ([place]) [F33.2] 08/08/2017   Total Time spent with patient: 20 minutes  Past Medical History:  Past Medical History:  Diagnosis Date  . Asthma   . Carpal tunnel syndrome     Past Surgical History:  Procedure Laterality Date  . WISDOM TOOTH EXTRACTION     Family History: History reviewed. No pertinent family history. Social History:  Social History   Substance and Sexual Activity  Alcohol Use No     Social History   Substance and Sexual Activity  Drug Use No    Social History   Socioeconomic History  . Marital status: Single    Spouse name: None  . Number of children: None  . Years of education: None  . Highest education level: None  Social Needs  . Financial resource strain: None  .  Food insecurity - worry: None  . Food insecurity - inability: None  . Transportation needs - medical: None  . Transportation needs - non-medical: None  Occupational History  . None  Tobacco Use  . Smoking status: Never Smoker  . Smokeless tobacco: Never Used  Substance and Sexual Activity  . Alcohol use: No  . Drug use: No  . Sexual activity: No    Birth control/protection: Implant  Other Topics Concern  . None  Social History Narrative  . None   Additional Social History:   Sleep: Good  Appetite:  Good  Current Medications: Current Facility-Administered Medications  Medication Dose Route Frequency Provider Last Rate Last Dose  . acetaminophen (TYLENOL) tablet 650 mg  650 mg Oral Q6H PRN Rozetta Nunnery, NP      . alum & mag hydroxide-simeth (MAALOX/MYLANTA) 200-200-20 MG/5ML suspension 30 mL  30 mL Oral Q4H PRN Lindon Romp A, NP      . escitalopram (LEXAPRO) tablet 5 mg  5 mg Oral Daily Zaliyah Meikle, Myer Peer, MD   5 mg at 08/10/17 1779  . hydrOXYzine (ATARAX/VISTARIL) tablet 25 mg  25 mg Oral TID PRN Rozetta Nunnery, NP      . magnesium hydroxide (MILK OF MAGNESIA) suspension 30 mL  30 mL Oral Daily PRN Lindon Romp A, NP      . traZODone (DESYREL) tablet 50 mg  50 mg Oral QHS PRN Rozetta Nunnery, NP        Lab Results:  Results for orders  placed or performed during the hospital encounter of 08/08/17 (from the past 48 hour(s))  TSH     Status: None   Collection Time: 08/10/17  6:23 AM  Result Value Ref Range   TSH 1.841 0.350 - 4.500 uIU/mL    Comment: Performed by a 3rd Generation assay with a functional sensitivity of <=0.01 uIU/mL. Performed at [facility], [place] [address], [place], [place] [address]     Blood Alcohol level:  Lab Results  Component Value Date   ETH <10 35/00/9381    Metabolic Disorder Labs: No results found for: HGBA1C, MPG No results found for: PROLACTIN No results found for: CHOL, TRIG, HDL, CHOLHDL, VLDL, LDLCALC  Physical  Findings: AIMS: Facial and Oral Movements Muscles of Facial Expression: None, normal Lips and Perioral Area: None, normal Jaw: None, normal Tongue: None, normal,Extremity Movements Upper (arms, wrists, hands, fingers): None, normal Lower (legs, knees, ankles, toes): None, normal, Trunk Movements Neck, shoulders, hips: None, normal, Overall Severity Severity of abnormal movements (highest score from questions above): None, normal Incapacitation due to abnormal movements: None, normal Patient's awareness of abnormal movements (rate only patient's report): No Awareness, Dental Status Current problems with teeth and/or dentures?: No Does patient usually wear dentures?: No  CIWA:    COWS:     Musculoskeletal: Strength & Muscle Tone: within normal limits Gait & Station: normal Patient leans: N/A  Psychiatric Specialty Exam: Physical Exam  ROS mild headache, no chest pain, no shortness of breath, no vomiting , no fever, or chills   Blood pressure (!) 142/91, pulse 91, temperature 98.5 F (36.9 C), temperature source Oral, resp. rate 16, height 5' 6"  (1.676 m), weight 131.5 kg (290 lb), last menstrual period 08/07/2017.Body mass index is 46.81 kg/m.  General Appearance: Fairly Groomed  Eye Contact:  Good  Speech:  Normal Rate  Volume:  Normal  Mood:  describes improving mood and presents with fuller range of affect  Affect:  improved range of affect, smiles appropriately at times   Thought Process:  Linear and Descriptions of Associations: Intact  Orientation:  Full (Time, Place, and Person)  Thought Content:  denies hallucinations, no delusions, not internally preoccupied   Suicidal Thoughts:  No denies any suicidal ideations, denies any plan or intention of suicide and contracts for safety on unit   Homicidal Thoughts:  No  Memory:  recent and remote grossly intact   Judgement:  Other:  improving   Insight:  improving   Psychomotor Activity:  Normal  Concentration:   Concentration: Good and Attention Span: Good  Recall:  Good  Fund of Knowledge:  Good  Language:  Good  Akathisia:  Negative  Handed:  Right  AIMS (if indicated):     Assets:  Communication Skills Desire for Improvement Resilience  ADL's:  Intact  Cognition:  WNL  Sleep:  Number of Hours: 6   Assessment - patient presents improved compared to admission. Presents with improved mood and fuller range, brighter affect . She denies current suicidal ideations,  and is currently future oriented . Thus far tolerating Lexapro well.  Treatment Plan Summary: Daily contact with patient to assess and evaluate symptoms and progress in treatment, Medication management, Plan inpatient treatment and medications as below  Encourage group and milieu participation to work on coping skills and symptom reduction Treatment team working on disposition planning options Continue Lexapro 5 mgrs QDAY for depression and anxiety  Continue Vistaril 25 mgrs Q 8 hours PRN for anxiety as needed  Continue  Trazodone 50 mgrs QHS PRN for insomnia as needed  Jenne Campus, MD 08/10/2017, 3:40 PM

## 2017-08-10 NOTE — Tx Team (Signed)
Interdisciplinary Treatment and Diagnostic Plan Update  08/10/2017 Time of Session: 1244 Sandy Smith MRN: [id number]  Principal Diagnosis: <principal problem not specified>  Secondary Diagnoses: Active Problems:   Severe recurrent major depression without psychotic features (HCC)   Current Medications:  Current Facility-Administered Medications  Medication Dose Route Frequency Provider Last Rate Last Dose  . acetaminophen (TYLENOL) tablet 650 mg  650 mg Oral Q6H PRN Jackelyn PolingBerry, Jason A, NP      . alum & mag hydroxide-simeth (MAALOX/MYLANTA) 200-200-20 MG/5ML suspension 30 mL  30 mL Oral Q4H PRN Nira ConnBerry, Jason A, NP      . escitalopram (LEXAPRO) tablet 5 mg  5 mg Oral Daily Cobos, Rockey SituFernando A, MD   5 mg at 08/10/17 [id number]  . hydrOXYzine (ATARAX/VISTARIL) tablet 25 mg  25 mg Oral TID PRN Jackelyn PolingBerry, Jason A, NP      . magnesium hydroxide (MILK OF MAGNESIA) suspension 30 mL  30 mL Oral Daily PRN Nira ConnBerry, Jason A, NP      . traZODone (DESYREL) tablet 50 mg  50 mg Oral QHS PRN Jackelyn PolingBerry, Jason A, NP       PTA Medications: Medications Prior to Admission  Medication Sig Dispense Refill Last Dose  . aspirin 81 MG chewable tablet Chew 648 mg by mouth once.   08/07/2017 at Unknown time  . etonogestrel (NEXPLANON) 68 MG IMPL implant 1 each by Subdermal route once.   CURRENT  . ibuprofen (ADVIL,MOTRIN) 600 MG tablet Take 1 tablet (600 mg total) by mouth every 6 (six) hours. 30 tablet 0 Past Month at Unknown time  . sodium chloride (OCEAN) 0.65 % SOLN nasal spray Place 2 sprays into both nostrils as needed for congestion.   Past Week at Unknown time    Patient Stressors: Educational concerns Financial difficulties Marital or family conflict  Patient Strengths: Ability for insight Average or above average intelligence Capable of independent living Wellsite geologistCommunication skills General fund of knowledge Motivation for treatment/growth  Treatment Modalities: Medication Management, Group therapy, Case management,  1  to 1 session with clinician, Psychoeducation, Recreational therapy.   Physician Treatment Plan for Primary Diagnosis: <principal problem not specified> Long Term Goal(s): Improvement in symptoms so as ready for discharge Improvement in symptoms so as ready for discharge   Short Term Goals: Ability to identify changes in lifestyle to reduce recurrence of condition will improve Ability to maintain clinical measurements within normal limits will improve Ability to verbalize feelings will improve Ability to disclose and discuss suicidal ideas Ability to demonstrate self-control will improve Ability to identify and develop effective coping behaviors will improve  Medication Management: Evaluate patient's response, side effects, and tolerance of medication regimen.  Therapeutic Interventions: 1 to 1 sessions, Unit Group sessions and Medication administration.  Evaluation of Outcomes: Progressing  Physician Treatment Plan for Secondary Diagnosis: Active Problems:   Severe recurrent major depression without psychotic features (HCC)  Long Term Goal(s): Improvement in symptoms so as ready for discharge Improvement in symptoms so as ready for discharge   Short Term Goals: Ability to identify changes in lifestyle to reduce recurrence of condition will improve Ability to maintain clinical measurements within normal limits will improve Ability to verbalize feelings will improve Ability to disclose and discuss suicidal ideas Ability to demonstrate self-control will improve Ability to identify and develop effective coping behaviors will improve     Medication Management: Evaluate patient's response, side effects, and tolerance of medication regimen.  Therapeutic Interventions: 1 to 1 sessions, Unit Group sessions and Medication administration.  Evaluation  of Outcomes: Progressing   RN Treatment Plan for Primary Diagnosis: <principal problem not specified> Long Term Goal(s): Knowledge of  disease and therapeutic regimen to maintain health will improve  Short Term Goals: Ability to identify and develop effective coping behaviors will improve and Compliance with prescribed medications will improve  Medication Management: RN will administer medications as ordered by provider, will assess and evaluate patient's response and provide education to patient for prescribed medication. RN will report any adverse and/or side effects to prescribing provider.  Therapeutic Interventions: 1 on 1 counseling sessions, Psychoeducation, Medication administration, Evaluate responses to treatment, Monitor vital signs and CBGs as ordered, Perform/monitor CIWA, COWS, AIMS and Fall Risk screenings as ordered, Perform wound care treatments as ordered.  Evaluation of Outcomes: Progressing   LCSW Treatment Plan for Primary Diagnosis: <principal problem not specified> Long Term Goal(s): Safe transition to appropriate next level of care at discharge, Engage patient in therapeutic group addressing interpersonal concerns.  Short Term Goals: Engage patient in aftercare planning with referrals and resources, Increase social support and Increase skills for wellness and recovery  Therapeutic Interventions: Assess for all discharge needs, 1 to 1 time with Social worker, Explore available resources and support systems, Assess for adequacy in community support network, Educate family and significant other(s) on suicide prevention, Complete Psychosocial Assessment, Interpersonal group therapy.  Evaluation of Outcomes: Progressing   Progress in Treatment: Attending groups: Yes. Participating in groups: Yes. Taking medication as prescribed: Yes. Toleration medication: Yes. Family/Significant other contact made: No, will contact:  none Patient understands diagnosis: Yes. Discussing patient identified problems/goals with staff: Yes. Medical problems stabilized or resolved: Yes. Denies suicidal/homicidal ideation:  Yes. Issues/concerns per patient self-inventory: No. Other: none  New problem(s) identified: No, Describe:  none  New Short Term/Long Term Goal(s):  Discharge Plan or Barriers:   Reason for Continuation of Hospitalization: Depression Medication stabilization  Estimated Length of Stay: 3-5 days.  Attendees: Patient: 08/10/2017   Physician: Dr Jama Flavorsobos, MD 08/10/2017   Nursing: Joslyn Devonaroline Beaudry, RN 08/10/2017   RN Care Manager: 08/10/2017   Social Worker: Daleen SquibbGreg Minnie Legros, LCSW 08/10/2017   Recreational Therapist:  08/10/2017   Other:  08/10/2017   Other:  08/10/2017   Other: 08/10/2017        Scribe for Treatment Team: Lorri FrederickWierda, Idolina Mantell Jon, LCSW 08/10/2017 12:44 PM

## 2017-08-10 NOTE — Progress Notes (Signed)
Recreation Therapy Notes  Date: 08/10/17 Time: 0930 Location: [address] Dayroom  Group Topic: Stress Management  Goal Area(s) Addresses:  Patient will verbalize importance of using healthy stress management.  Patient will identify positive emotions associated with healthy stress management.   Intervention: Stress Management  Activity :  Meditation.  LRT introduced the stress management technique of meditation.  LRT played a meditation that allowed patients to take inventory of the sensations they may be feeling throughout their bodies.  Education:  Stress Management, Discharge Planning.   Education Outcome: Acknowledges edcuation/In group clarification offered/Needs additional education  Clinical Observations/Feedback: Pt did not attend group.    Caroll RancherMarjette Brett Soza, LRT/CTRS         Caroll RancherLindsay, Lawerance Matsuo A 08/10/2017 11:44 AM

## 2017-08-10 NOTE — Plan of Care (Signed)
  Progressing Medication: Compliance with prescribed medication regimen will improve 08/10/2017 1351 - Progressing by Lenord Fellersopson, Telena Peyser Elizabeth, RN Activity: Interest or engagement in leisure activities will improve 08/10/2017 1351 - Progressing by Lenord Fellersopson, Gokul Waybright Elizabeth, RN

## 2017-08-10 NOTE — [facility] Group Notes (Signed)
[facility] LCSW Group Therapy Note  Date/Time: 08/10/17, 1315  Type of Therapy and Topic:  Group Therapy:  Overcoming Obstacles  Participation Level:  active  Description of Group:    In this group patients will be encouraged to explore what they see as obstacles to their own wellness and recovery. They will be guided to discuss their thoughts, feelings, and behaviors related to these obstacles. The group will process together ways to cope with barriers, with attention given to specific choices patients can make. Each patient will be challenged to identify changes they are motivated to make in order to overcome their obstacles. This group will be process-oriented, with patients participating in exploration of their own experiences as well as giving and receiving support and challenge from other group members.  Therapeutic Goals: 1. Patient will identify personal and current obstacles as they relate to admission. 2. Patient will identify barriers that currently interfere with their wellness or overcoming obstacles.  3. Patient will identify feelings, thought process and behaviors related to these barriers. 4. Patient will identify two changes they are willing to make to overcome these obstacles:    Summary of Patient Progress: Pt shared that she is trying to work two jobs, attend school, and raise a 20 year old child at the same time.  Pt reports the stress of all this is her biggest obstacle and participated in group discussion about coping skills to deal with the stress of her situation.      Therapeutic Modalities:   Cognitive Behavioral Therapy Solution Focused Therapy Motivational Interviewing Relapse Prevention Therapy  Daleen SquibbGreg Glen Blatchley, LCSW

## 2017-08-10 NOTE — Progress Notes (Signed)
Flat affect.  Offers little.  Sts she is not ready to go home.  Playing game with other pts.

## 2017-08-10 NOTE — Progress Notes (Signed)
Patient ID: Sandy Smith, female   DOB: Jan 02, 1996, 21 y.o.   MRN: [id number]  DAR: Pt. Denies SI/HI and A/V Hallucinations. She reports that her sleep was good last night, her appetite is good, energy level is normal, and concentration is good. She rates her depression, hopelessness, and anxiety level 0/10. Patient reports some minimal cramping in her lower abdomen but refuses intervention at this time. She received an influenza vaccine and tolerated well. Support and encouragement provided to the patient. Scheduled medication administered to patient per physician's orders. Patient is seen in the milieu interacting with her peers. She is animated during conversation with Clinical research associatewriter and pleasant. Writer checked on patient this afternoon and when asked how she is doing patient stated, "I have some ice cream so I'm good." Q15 minute checks are maintained for safety.

## 2017-08-10 NOTE — [facility] Group Notes (Signed)
Adult Psychoeducational Group Note  Date:  08/10/2017 Time:  8:59 PM  Group Topic/Focus:  Wrap-Up Group:   The focus of this group is to help patients review their daily goal of treatment and discuss progress on daily workbooks.  Participation Level:  Active  Participation Quality:  Appropriate and Attentive  Affect:  Appropriate  Cognitive:  Alert and Appropriate  Insight: Appropriate and Good  Engagement in Group:  Engaged  Modes of Intervention:  Discussion  Additional Comments:Pt  Rated her day a 10 because she has a burst of energy and is starting to feel better. No SI/HI.  Berlin HunWatlington, Damontay Alred A 08/10/2017, 8:59 PM

## 2017-08-11 DIAGNOSIS — F322 Major depressive disorder, single episode, severe without psychotic features: Secondary | ICD-10-CM

## 2017-08-11 DIAGNOSIS — F419 Anxiety disorder, unspecified: Secondary | ICD-10-CM

## 2017-08-11 DIAGNOSIS — F39 Unspecified mood [affective] disorder: Secondary | ICD-10-CM

## 2017-08-11 LAB — HEMOGLOBIN A1C
Hgb A1c MFr Bld: 7.6 % — ABNORMAL HIGH (ref 4.8–5.6)
Mean Plasma Glucose: 171 mg/dL

## 2017-08-11 MED ORDER — ESCITALOPRAM OXALATE 10 MG PO TABS
10.0000 mg | ORAL_TABLET | Freq: Every day | ORAL | Status: DC
  Administered 2017-08-12: 10 mg via ORAL
  Filled 2017-08-11: qty 1
  Filled 2017-08-11: qty 7
  Filled 2017-08-11: qty 1

## 2017-08-11 NOTE — Progress Notes (Signed)
Recreation Therapy Notes  Animal-Assisted Activity (AAA) Program Checklist/Progress Notes Patient Eligibility Criteria Checklist & Daily Group note for Rec TxIntervention  Date: 12.18.2018 Time: 2:45pm Location: [address]   AAA/T Program Assumption of Risk Form signed by Patient/ or Parent Legal Guardian Yes  Patient is free of allergies or sever asthma Yes  Patient reports no fear of animals Yes  Patient reports no history of cruelty to animals Yes  Patient understands his/her participation is voluntary Yes  Patient washes hands before animal contact Yes  Patient washes hands after animal contact Yes  Behavioral Response: Appropriate   Education:Hand Washing, Appropriate Animal Interaction   Education Outcome: Acknowledges education.   Clinical Observations/Feedback: Patient attended session and interacted appropriately with therapy dog and peers.   Marykay Lexenise L Sally Reimers, LRT/CTRS       Chessie Neuharth L 08/11/2017 3:12 PM

## 2017-08-11 NOTE — Progress Notes (Signed)
Patient ID: Sandy Smith, female   DOB: Jan 22, 1996, 21 y.o.   MRN: [id number]  MD Izediuno made aware of patient's elevated BP during treatment team and that her BP and pulse is fluctuating between high and low intermittently.

## 2017-08-11 NOTE — [facility] Suicide Risk Assessment (Signed)
[facility] INPATIENT:  Family/Significant Other Suicide Prevention Education  Suicide Prevention Education:  Education Completed; Eduard RouxMary Ellen Sullivan, "mother" [phone], has been identified by the patient as the family member/significant other with whom the patient will be residing, and identified as the person(s) who will aid the patient in the event of a mental health crisis (suicidal ideations/suicide attempt).  With written consent from the patient, the family member/significant other has been provided the following suicide prevention education, prior to the and/or following the discharge of the patient.  The suicide prevention education provided includes the following:  Suicide risk factors  Suicide prevention and interventions  National Suicide Hotline telephone number  [facility] assessment telephone number  [facility] Emergency Assistance 911  [facility] and/or Residential Mobile Crisis Unit telephone number  Request made of family/significant other to:  Remove weapons (e.g., guns, rifles, knives), all items previously/currently identified as safety concern.  No guns in the home, per Gaylyn LambertMary Ellen.  Remove drugs/medications (over-the-counter, prescriptions, illicit drugs), all items previously/currently identified as a safety concern. Octaviano GlowMaary Ellen will check and secure medications in the home.  The family member/significant other verbalizes understanding of the suicide prevention education information provided.  The family member/significant other agrees to remove the items of safety concern listed above.  Gaylyn LambertMary Ellen has raised pt but she is not related by blood.  She took child at a young age because she was friend's with pt's grandmother, who was also taking in some of this pt's siblings.  There have been problems recently with pt stealing from [place].  Initially, it was $400.  This time, pt charged $6000 to [employer] GroupMary's credit card and [place] confronted her about it.  Corrie DandyMary  wants to continue to support pt but does need to talk with pt about these issues moving forward.  Lorri FrederickWierda, Donesha Wallander Jon, LCSW 08/11/2017, 3:49 PM

## 2017-08-11 NOTE — Progress Notes (Signed)
Patient ID: Sandy Smith, female   DOB: July 12, 1996, 21 y.o.   MRN: [id number]  D: Patient pleasant and cooperative with care this shift and is noted to interact well with peers in the milieu and is in the dayroom for much of the shift. Pt with complaints of mild anxiety this shift. A: Q 15 minute safety checks, encourage staff/peer interaction and group participation. Administer medications as ordered.R: Pt compliant with medications and attended group session. Pt denies SI at this time and verbally contracts for safety. No signs/symptoms of distress noted.

## 2017-08-11 NOTE — Progress Notes (Signed)
[facility]  08/11/2017 11:52 AM Melinda Crutchiffany Brocato  MRN:  [id number]   Subjective: Patient reports that she is feeling fine today. She doesn't feel a significant change from the medication, but denies any SI/HI/AVH and contracts for safety. She is hoping to discharge soon.   Objective : Patient's chart and findings reviewed and discussed with treatment team. Patient presents pleasant and cooperative. Patient has been seen in the day room interacting with peers and staff appropriately. Patient has been attending groups and participating. Will increase Lexapro to 10 mg Daily.   Principal Problem: MDD Diagnosis:   Patient Active Problem List   Diagnosis Date Noted  . 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 Severe recurrent major depression without psychotic features (HCC) [F33.2] 08/08/2017   Total Time spent with patient: 25 minutes  Past Medical History:  Past Medical History:  Diagnosis Date  . Asthma   . Carpal tunnel syndrome     Past Surgical History:  Procedure Laterality Date  . WISDOM TOOTH EXTRACTION     Family History: History reviewed. No pertinent family history. Social History:  Social History   Substance and Sexual Activity  Alcohol Use No     Social History   Substance and Sexual Activity  Drug Use No    Social History   Socioeconomic History  . Marital status: Single    Spouse name: None  . Number of children: None  . Years of education: None  . Highest education level: None  Social Needs  . Financial resource strain: None  . Food insecurity - worry: None  . Food insecurity - inability: None  . Transportation needs - medical: None  . Transportation needs - non-medical: None  Occupational History  . None  Tobacco Use  . Smoking status: Never Smoker  . Smokeless tobacco: Never Used  Substance and Sexual Activity  . Alcohol use: No  . Drug use: No  . Sexual activity: No     Birth control/protection: Implant  Other Topics Concern  . None  Social History Narrative  . None   Additional Social History:   Sleep: Good  Appetite:  Good  Current Medications: Current Facility-Administered Medications  Medication Dose Route Frequency Provider Last Rate Last Dose  . acetaminophen (TYLENOL) tablet 650 mg  650 mg Oral Q6H PRN Nira ConnBerry, Jason A, NP      . alum & mag hydroxide-simeth (MAALOX/MYLANTA) 200-200-20 MG/5ML suspension 30 mL  30 mL Oral Q4H PRN Nira ConnBerry, Jason A, NP      . escitalopram (LEXAPRO) tablet 5 mg  5 mg Oral Daily Cobos, Rockey SituFernando A, MD   5 mg at 08/11/17 0810  . hydrOXYzine (ATARAX/VISTARIL) tablet 25 mg  25 mg Oral TID PRN Jackelyn PolingBerry, Jason A, NP   25 mg at 08/10/17 2105  . magnesium hydroxide (MILK OF MAGNESIA) suspension 30 mL  30 mL Oral Daily PRN Nira ConnBerry, Jason A, NP      . traZODone (DESYREL) tablet 50 mg  50 mg Oral QHS PRN Jackelyn PolingBerry, Jason A, NP   50 mg at 08/10/17 2105    Lab Results:  Results for orders placed or performed during the hospital encounter of 08/08/17 (from the past 48 hour(s))  TSH     Status: None   Collection Time: 08/10/17  6:23 AM  Result Value Ref Range   TSH 1.841 0.350 - 4.500 uIU/mL    Comment: Performed by a 3rd Generation assay with a functional sensitivity  of <=0.01 uIU/mL. Performed at [facility], [address] [place], [place] [address]   Hemoglobin A1c     Status: Abnormal   Collection Time: 08/10/17  6:23 AM  Result Value Ref Range   Hgb A1c MFr Bld 7.6 (H) 4.8 - 5.6 %    Comment: (NOTE)         Prediabetes: 5.7 - 6.4         Diabetes: >6.4         Glycemic control for adults with diabetes: <7.0    Mean Plasma Glucose 171 mg/dL    Comment: (NOTE) Performed At: [facility] [address] [place], [place] [id number] Jolene Schimke MD [id number] Performed at [facility], [address], [place], [place] [address]     Blood Alcohol level:  Lab Results   Component Value Date   ETH <10 08/07/2017    Metabolic Disorder Labs: Lab Results  Component Value Date   HGBA1C 7.6 (H) 08/10/2017   MPG 171 08/10/2017   No results found for: PROLACTIN No results found for: CHOL, TRIG, HDL, CHOLHDL, VLDL, LDLCALC  Physical Findings: AIMS: Facial and Oral Movements Muscles of Facial Expression: None, normal Lips and Perioral Area: None, normal Jaw: None, normal Tongue: None, normal,Extremity Movements Upper (arms, wrists, hands, fingers): None, normal Lower (legs, knees, ankles, toes): None, normal, Trunk Movements Neck, shoulders, hips: None, normal, Overall Severity Severity of abnormal movements (highest score from questions above): None, normal Incapacitation due to abnormal movements: None, normal Patient's awareness of abnormal movements (rate only patient's report): No Awareness, Dental Status Current problems with teeth and/or dentures?: No Does patient usually wear dentures?: No  CIWA:    COWS:     Musculoskeletal: Strength & Muscle Tone: within normal limits Gait & Station: normal Patient leans: N/A  Psychiatric Specialty Exam: Physical Exam  Nursing note and vitals reviewed. Constitutional: She is oriented to person, place, and time. She appears well-developed and well-nourished.  Respiratory: Effort normal.  Musculoskeletal: Normal range of motion.  Neurological: She is alert and oriented to person, place, and time.  Skin: Skin is warm.    Review of Systems  Constitutional: Negative.   HENT: Negative.   Eyes: Negative.   Respiratory: Negative.   Cardiovascular: Negative.   Gastrointestinal: Negative.   Genitourinary: Negative.   Musculoskeletal: Negative.   Skin: Negative.   Neurological: Negative.   Endo/Heme/Allergies: Negative.   Psychiatric/Behavioral: Negative.      Blood pressure (!) 114/59, pulse (!) 102, temperature 97.9 F (36.6 C), temperature source Oral, resp. rate 18, height 5\' 6"  (1.676 m),  weight 131.5 kg (290 lb), last menstrual period 08/07/2017.Body mass index is 46.81 kg/m.  General Appearance: Fairly Groomed  Eye Contact:  Good  Speech:  Normal Rate  Volume:  Normal  Mood:  Euthymic  Affect:  Appropriate  Thought Process:  Goal Directed and Descriptions of Associations: Intact  Orientation:  Full (Time, Place, and Person)  Thought Content:  WDL  Suicidal Thoughts:  No   Homicidal Thoughts:  No  Memory:  Immediate;   Good Recent;   Good Remote;   Good  Judgement:  Fair  Insight:  Fair  Psychomotor Activity:  Normal  Concentration:  Concentration: Good and Attention Span: Good  Recall:  Good  Fund of Knowledge:  Good  Language:  Good  Akathisia:  Negative  Handed:  Right  AIMS (if indicated):     Assets:  Communication Skills Desire for Improvement Housing Social  Support Transportation  ADL's:  Intact  Cognition:  WNL  Sleep:  Number of Hours: 6.75   Problems Addressed: .MDD severe  Treatment Plan Summary: Daily contact with patient to assess and evaluate symptoms and progress in treatment, Medication management and Plan is to:  -Increase Lexapro 10 mg PO Daily for mood stability -Continue Trazodone 50 mg PO Daily for mood stability -Continue Vistaril 25 mg PO TID PRN for anxiety -Encourage group therapy participation   Maryfrances Bunnellravis B Semya Klinke, FNP  08/11/17

## 2017-08-11 NOTE — Plan of Care (Signed)
  Progressing Safety: Periods of time without injury will increase 08/11/2017 2354 - Progressing by Arrie Aranhurch, Vadie Principato J, RN Note Pt has not harmed self or others tonight.  She denies SI/HI and verbally contracts for safety.

## 2017-08-11 NOTE — Progress Notes (Signed)
Patient ID: Sandy Smith, female   DOB: [birth year], 21 y.o.   MRN: [id number]  DAR: Pt. Denies SI/HI and A/V Hallucinations. She reports that her sleep last night was good, her appetite is good, and her concentration is good. She rates her depression, hopelessness, and anxiety 0/10.  Patient does not report any pain or discomfort at this time. Support and encouragement provided to the patient. Scheduled medication administered to patient per physician's orders. Patient is minimal with Clinical research associatewriter but cooperative. She initially reports she is "tired" this morning and lays back down in bed after receiving her morning medication however she is now visible in the milieu. Q15 minute checks are maintained for safety.

## 2017-08-11 NOTE — Progress Notes (Signed)
D: Pt was in the dayroom playing cards with peers upon initial approach.  Pt presents with appropriate affect and anxious mood.  Reports her day was "really good" and her goal was "just to have a good day."  Pt denies SI/HI, denies hallucinations, denies pain.  Pt has been visible in milieu interacting with peers and staff appropriately.  Pt attended evening group.    A: Introduced self to pt.  Actively listened to pt and offered support and encouragement.  PRN medication administered for sleep and anxiety per request.  Q15 minute safety checks maintained.  R: Pt is safe on the unit.  Pt is compliant with medications.  Pt verbally contracts for safety.  Will continue to monitor and assess.

## 2017-08-11 NOTE — Plan of Care (Signed)
  Progressing Medication: Compliance with prescribed medication regimen will improve 08/11/2017 1153 - Progressing by Lenord Fellersopson, Ronnita Paz Elizabeth, RN

## 2017-08-11 NOTE — Progress Notes (Signed)
Adult Psychoeducational Group Note  Date:  08/11/2017 Time:  9:06 PM  Group Topic/Focus:  Wrap-Up Group:   The focus of this group is to help patients review their daily goal of treatment and discuss progress on daily workbooks.  Participation Level:  Active  Participation Quality:  Appropriate  Affect:  Appropriate  Cognitive:  Appropriate  Insight: Appropriate  Engagement in Group:  Engaged  Modes of Intervention:  Activity  Additional Comments:  Patient rated her day a 10 and goal is to plan for discharge.  Sandy Smith 08/11/2017, 9:06 PM

## 2017-08-12 DIAGNOSIS — F1420 Cocaine dependence, uncomplicated: Secondary | ICD-10-CM

## 2017-08-12 MED ORDER — HYDROXYZINE HCL 25 MG PO TABS: 25.0000 mg | ORAL_TABLET | Freq: Three times a day (TID) | ORAL | 0 refills | Status: AC | PRN

## 2017-08-12 MED ORDER — ESCITALOPRAM OXALATE 10 MG PO TABS: 10.0000 mg | ORAL_TABLET | Freq: Every day | ORAL | 0 refills | Status: DC

## 2017-08-12 MED ORDER — TRAZODONE HCL 50 MG PO TABS: 50.0000 mg | ORAL_TABLET | Freq: Every evening | ORAL | 0 refills | Status: DC | PRN

## 2017-08-12 NOTE — Progress Notes (Signed)
Recreation Therapy Notes  Date: 08/12/17 Time: 0930 Location: 300 Hall Dayroom  Group Topic: Stress Management  Goal Area(s) Addresses:  Patient will verbalize importance of using healthy stress management.  Patient will identify positive emotions associated with healthy stress management.   Intervention: Stress Management  Activity : Guided Imagery.  LRT introduced the stress management technique of guided imagery.  Patients were to listen and follow along as LRT read script to fully engage in the technique.  Education:  Stress Management, Discharge Planning.   Education Outcome: Acknowledges edcuation/In group clarification offered/Needs additional education  Clinical Observations/Feedback: Pt did not attend group.    Caroll RancherMarjette Tagg Eustice, LRT/CTRS         Caroll RancherLindsay, Ramey Schiff A 08/12/2017 12:41 PM

## 2017-08-12 NOTE — Progress Notes (Signed)
  [facility] Discharge [facility] :  Will you be returning to the same living situation after discharge:  Yes,  with mother At discharge, do you have transportation home?: Yes,  mother Do you have the ability to pay for your medications: No. [facility].  Release of information consent forms completed and in the chart;  Patient's signature needed at discharge.  Patient to Follow up at: Follow-up Information    [facility]. Go on 08/14/2017.   Why:  Please attend [facility] on Friday, 08/14/17, at 1:00pm.  Please bring a copy of your hospital discharge paperwork. Contact information: [address] [place] [address] [phone]           Next level of care provider has [facility] Link:no  Safety Planning and Suicide Prevention discussed: Yes,  with mother  Have you used any form of tobacco in the last 30 days? (Cigarettes, Smokeless Tobacco, Cigars, and/or Pipes): No  Has patient been referred to the Quitline?: N/A patient is not a smoker  Patient has been referred for addiction treatment: Yes  Lorri FrederickWierda, Syanna Remmert Jon, LCSW 08/12/2017, 11:00 AM

## 2017-08-12 NOTE — Progress Notes (Signed)
Patient ID: Sandy Smith, female   DOB: [birth year], 21 y.o.   MRN: [id number]  Discharge Note- Belongings retrieved from bedside by patient at time of discharge. Discharge instructions and medications were reviewed with patient. Patient verbalized understanding of both medications and discharge instructions. Patient discharged to lobby where her ride was waiting. Q15 minute safety checks maintained until time of discharge.

## 2017-08-12 NOTE — Progress Notes (Signed)
Patient ID: Sandy Smith, female   DOB: 03/28/1996, 21 y.o.   MRN: [id number]  DAR: Pt. Denies SI/HI and A/V Hallucinations. She reports that her sleep last night was good, her appetite is good, her energy level is normal, and her concentration is good. She rates her depression, hopelessness, and anxiety levels 0/10. Patient does not report any pain or discomfort at this time. Support and encouragement provided to the patient. Scheduled medications administered to patient per physician's orders. Patient is receptive and cooperative. She is seen in the milieu and interacting with her peers. She reports that she feels ready for discharge and her mother will be picking her up. Q15 minute checks are maintained for safety.

## 2017-08-12 NOTE — Discharge Summary (Signed)
Physician Discharge Summary Note  Patient:  Sandy Smith is an 21 y.o., female MRN:  [id number] DOB:  August 16, 1996 Patient phone:  [phone] (home)  Patient address:   [place] [address],   Total Time spent with patient: 20 minutes  Date of Admission:  08/08/2017 Date of Discharge: 08/12/17   Reason for Admission:  Worsening depression with overdose attempt  Principal Problem: Severe recurrent major depression without psychotic features [facility]) Discharge Diagnoses: Patient Active Problem List   Diagnosis Date Noted  . Major depressive disorder, single episode, severe without psychotic features ([place]) [F32.2] 08/08/2017  . Severe recurrent major depression without psychotic features ([place]) [F33.2] 08/08/2017    Past Psychiatric History: No prior psychiatric admissions, no history of prior suicide attempts, denies history of self cutting, but states she scratches skin when anxious, no history of mania, no history of psychosis, denies history of violence . States she has not been on any psychiatric medications in the past   Past Medical History:  Past Medical History:  Diagnosis Date  . Asthma   . Carpal tunnel syndrome     Past Surgical History:  Procedure Laterality Date  . WISDOM TOOTH EXTRACTION     Family History: History reviewed. No pertinent family history. 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  Social History:  Social History   Substance and Sexual Activity  Alcohol Use No     Social History   Substance and Sexual Activity  Drug Use No    Social History   Socioeconomic History  . Marital status: Single    Spouse name: None  . Number of children: None  . Years of education: None  . Highest education level: None  Social Needs  . Financial resource strain: None  . Food insecurity - worry: None  . Food insecurity - inability: None  .  Transportation needs - medical: None  . Transportation needs - non-medical: None  Occupational History  . None  Tobacco Use  . Smoking status: Never Smoker  . Smokeless tobacco: Never Used  Substance and Sexual Activity  . Alcohol use: No  . Drug use: No  . Sexual activity: No    Birth control/protection: Implant  Other Topics Concern  . None  Social History Narrative  . None    Hospital Course:   08/07/17 [facility] Counselor Assessment: 21 y.o. female that presents voluntary this date after attempting to harm herself by overdosing on aspirin. Patient speaks in a low soft voice and is very tearful during the assessment. Patient is drowsy and renders limited history and is vague in reference to the details of the incident that occurred earlier this date. Patient does admit to active S/I stating she was trying to "take her life" earlier this date by overdosing on aspirin. Patient reports the incident was due to ongoing conflict with her mother. Patient states she currently has a two year old child and reports ongoing financial issues. Patient stated she attempted to use her mother's credit card earlier this date but would not elaborate on the details of that incident. Patient stated it "made her mother so mad that she was going to charge her." Patient is unsure if legal charges have been filed at this time. Patient stated she was so overwhelmed by her mother's reactions that she attempted to overdose on aspirin. Patient is unsure how many pills she ingested but stated "it was the  whole bottle." Patient denies any previous attempts/gestures at self harm or any history of MH disorder/s. Patient denies any SA history, H/I or AVH. Patient is time/place oriented and is very tearful as this Probation officer attempts to complete assessment. Per record review patient has a limited [place] history and reports she has never been diagnosed with any disorder although she feels she has been suffering from depression "for some time."  Patient reports ongoing symptoms to include: excessive sadness, guilt and fatigue. Patient states she has never been prescribed any psychotropic medications in the past but is open to medication interventions at this time to assist with symptoms. Per note review, patient presented to [facility] by EMS after ingesting a bottle of aspirin. Per EMS there was a small bottle of 85m, 36 count. When [employer] arrived patient stated she wanted to die and was hysterically crying in route to hospital. Case was staffed with LReita ClicheDNP who reported patient met criteria for a inpatient admission to assist with stabilization as appropriate bed placement is investigated once cleared by poison control.   08/09/17 [facility] MD Assessment: 21year old single female, lives with mother. She overdosed on ASA ( reports she took about 30 ASA tablets - 81 mgrs ) . This occurred two days ago. States it was impulsive, unplanned, and followed an argument with her mother concerning her using mother's credit card . Mother called [phone] was brought to hospital. States she has been depressed " on an off" for years, but states that she has been more depressed recently  and endorses significant neuro-vegetative symptoms of depression as below, and states she had been having passive SI ( no plan or intent ) recently . Denies psychotic symptoms   [facility] and stabilized with medication and therapy. Patient is started on Lexapro and titrated to 10 mg Daily and also used Trazodone and Vistaril PRN during the visit. Patient showed improvement with improved mood, affect, appetite, sleep, and interaction. Patient was seen in the day room interacting appropriately with peers and staff. Patient was seen attending groups and participating. Patient is provided with prescriptions for her medications upon discharge. Patient denies any SI/HI/AVH and contracts for safety. [facility]   Physical  Findings: AIMS: Facial and Oral Movements Muscles of Facial Expression: None, normal Lips and Perioral Area: None, normal Jaw: None, normal Tongue: None, normal,Extremity Movements Upper (arms, wrists, hands, fingers): None, normal Lower (legs, knees, ankles, toes): None, normal, Trunk Movements Neck, shoulders, hips: None, normal, Overall Severity Severity of abnormal movements (highest score from questions above): None, normal Incapacitation due to abnormal movements: None, normal Patient's awareness of abnormal movements (rate only patient's report): No Awareness, Dental Status Current problems with teeth and/or dentures?: No Does patient usually wear dentures?: No  CIWA:    COWS:     Musculoskeletal: Strength & Muscle Tone: within normal limits Gait & Station: normal Patient leans: N/A  Psychiatric Specialty Exam: Physical Exam  Nursing note and vitals reviewed. Constitutional: She is oriented to person, place, and time. She appears well-developed and well-nourished.  Respiratory: Effort normal.  Musculoskeletal: Normal range of motion.  Neurological: She is alert and oriented to person, place, and time.  Skin: Skin is warm.    Review of Systems  Constitutional: Negative.   HENT: Negative.   Eyes: Negative.   Respiratory: Negative.   Cardiovascular: Negative.   Gastrointestinal: Negative.   Genitourinary: Negative.   Musculoskeletal: Negative.  Skin: Negative.   Neurological: Negative.   Endo/Heme/Allergies: Negative.   Psychiatric/Behavioral: Negative.     Blood pressure 129/86, pulse 88, temperature 98.3 F (36.8 C), temperature source Oral, resp. rate 16, height _0  (1.676 m), weight 131.5 kg (290 lb), last menstrual period 08/07/2017.Body mass index is 46.81 kg/m.  General Appearance: Casual  Eye Contact:  Good  Speech:  Clear and Coherent and Normal Rate  Volume:  Normal  Mood:  Euthymic  Affect:  Appropriate  Thought Process:  Goal Directed and  Descriptions of Associations: Intact  Orientation:  Full (Time, Place, and Person)  Thought Content:  WDL  Suicidal Thoughts:  No  Homicidal Thoughts:  No  Memory:  Immediate;   Good Recent;   Good Remote;   Good  Judgement:  Good  Insight:  Good  Psychomotor Activity:  Normal  Concentration:  Concentration: Good and Attention Span: Good  Recall:  Good  Fund of Knowledge:  Good  Language:  Good  Akathisia:  No  Handed:  Right  AIMS (if indicated):     Assets:  Communication Skills Desire for Improvement Financial Resources/Insurance Housing Physical Health Social Support Transportation  ADL's:  Intact  Cognition:  WNL  Sleep:  Number of Hours: 6.75     Have you used any form of tobacco in the last 30 days? (Cigarettes, Smokeless Tobacco, Cigars, and/or Pipes): No  Has this patient used any form of tobacco in the last 30 days? (Cigarettes, Smokeless Tobacco, Cigars, and/or Pipes) Yes, No  Blood Alcohol level:  Lab Results  Component Value Date   ETH <10 15/40/0867    Metabolic Disorder Labs:  Lab Results  Component Value Date   HGBA1C 7.6 (H) 08/10/2017   MPG 171 08/10/2017   No results found for: PROLACTIN No results found for: CHOL, TRIG, HDL, CHOLHDL, VLDL, LDLCALC  See Psychiatric Specialty Exam and Suicide Risk Assessment completed by Attending Physician prior to discharge.  Discharge destination:  Home  Is patient on multiple antipsychotic therapies at discharge:  No   Has Patient had three or more failed trials of antipsychotic monotherapy by history:  No  Recommended Plan for Multiple Antipsychotic Therapies: NA   Allergies as of 08/12/2017      Reactions   Shellfish Allergy Other (See Comments)   Unknown reaction: allergy discovered by allergy testing       Medication List    STOP taking these medications   aspirin 81 MG chewable tablet   ibuprofen 600 MG tablet Commonly known as:  ADVIL,MOTRIN   sodium chloride 0.65 % Soln nasal  spray Commonly known as:  OCEAN     TAKE these medications     Indication  escitalopram 10 MG tablet Commonly known as:  LEXAPRO Take 1 tablet (10 mg total) by mouth daily. For mood control Start taking on:  08/13/2017  Indication:  mood stability   hydrOXYzine 25 MG tablet Commonly known as:  ATARAX/VISTARIL Take 1 tablet (25 mg total) by mouth 3 (three) times daily as needed for anxiety.  Indication:  Feeling Anxious   NEXPLANON 68 MG Impl implant Generic drug:  etonogestrel 1 each by Subdermal route once.  Indication:  Birth Control Treatment   traZODone 50 MG tablet Commonly known as:  DESYREL Take 1 tablet (50 mg total) by mouth at bedtime as needed for sleep.  Indication:  Trouble Sleeping      Follow-up [facility]. Go on 08/14/2017.   Why:  Please attend your  [facility] on Friday, 08/14/17, at 1:00pm.  Please bring a copy of your hospital discharge paperwork. Contact information: [place] [address] [phone]           Follow-up recommendations:  Continue activity as tolerated. Continue diet as recommended by your PCP. Ensure to keep all appointments with outpatient providers.  Comments:  Patient is instructed prior to discharge to: Take all medications as prescribed by his/her mental healthcare provider. Report any adverse effects and or reactions from the medicines to his/her outpatient provider promptly. Patient has been instructed & cautioned: To not engage in alcohol and or illegal drug use while on prescription medicines. In the event of worsening symptoms, patient is instructed to call the crisis hotline, 911 and or go to the nearest ED for appropriate evaluation and treatment of symptoms. To follow-up with his/her primary care provider for your other medical issues, concerns and or health care needs.    Signed: [place], FNP 08/12/2017, 4:11 PM

## 2017-08-12 NOTE — [facility] Suicide Risk Assessment (Signed)
[facility] Discharge Suicide Risk Assessment   Principal Problem: Suicidal attempt. Discharge Diagnoses: Adjustment disorder  Patient Active Problem List   Diagnosis Date Noted  . Major depressive disorder, single episode, severe without psychotic features ([place]) [F32.2] 08/08/2017  . Severe recurrent major depression without psychotic features ([place]) [F33.2] 08/08/2017    Total Time spent with patient: 30 minutes  Musculoskeletal: Strength & Muscle Tone: within normal limits Gait & Station: normal Patient leans: N/A  Psychiatric Specialty Exam: Review of Systems  Constitutional: Negative.   HENT: Negative.   Eyes: Negative.   Cardiovascular: Negative.   Gastrointestinal: Negative.   Genitourinary: Negative.   Musculoskeletal: Negative.   Skin: Negative.   Neurological: Negative.   Endo/Heme/Allergies: Negative.   Psychiatric/Behavioral: Negative for depression, hallucinations, memory loss, substance abuse and suicidal ideas. The patient is not nervous/anxious and does not have insomnia.     Blood pressure 129/86, pulse (!) 118, temperature 98.3 F (36.8 C), temperature source Oral, resp. rate 16, height '5\' 6"'$  (1.676 m), weight 131.5 kg (290 lb), last menstrual period 08/07/2017.Body mass index is 46.81 kg/m.  General Appearance: Overweight, neatly dressed, pleasant, engaging well and cooperative. Appropriate behavior. Not in any distress. Good relatedness. Not internally stimulated  Eye Contact::  Good  Speech:  Clear and Coherent and Normal Rate409  Volume:  Normal  Mood:  Euthymic  Affect:  Appropriate and Full Range  Thought Process:  Linear  Orientation:  Full (Time, Place, and Person)  Thought Content:  Future oriented. No delusional theme. No preoccupation with violent thoughts. No negative ruminations. No obsession.  No hallucination in any modality.   Suicidal Thoughts:  No  Homicidal Thoughts:  No  Memory:  Immediate;   Good Recent;   Good Remote;   Good  Judgement:   Good  Insight:  Good  Psychomotor Activity:  Normal  Concentration:  Good  Recall:  Good  Fund of Knowledge:Good  Language: Good  Akathisia:  Negative  Handed:    AIMS (if indicated):     Assets:  Communication Skills Desire for Improvement Financial Resources/Insurance Housing Physical Health Resilience Talents/Skills Transportation Vocational/Educational  Sleep:  Number of Hours: 6.75  Cognition: WNL  ADL's:  Intact   Clinical Assessment::    21 y.o AAF, single, lives with her mother and her two year old child, Electronics engineer, works part-time. No past history of mental illness. Presented to the ER via the police. Reported to have overdosed on 36 pills of ASA . Blood levels of ASA below toxic limit. No clinical signs of ASA toxicity at presentation. Patient reports suicidal attempt as impulsive. Her mother just found out that she had spent some money without her permission. Says they had an argument and she acted impulsively. Patient was started on Lexapro here.  Chart reviewed today. Patient discussed at team. Staff reports that she has been doing well. She has been grooming self. She has been interacting normally with peers. She enjoys to play cards. She has not voiced any futility thoughts. She has been maintaining normal biological functions.  I met with her today. Patient reports no past history of mental illness. Says she has never attempted suicide in the past. She did not contemplate this act. Says she acted impulsively when her mother confronted her. Patient regrets the overdose. Says she cannot imagine how it would have affected her daughter. Patient says she has discussed the issue with her mom. Says they have worked their differences out. Patient says she spent just over $2500. Says  her mom spent the rest that put the credit card bills at $6000. Patient plans to get back to work. She is already on winter break.  Reports that she is in good spirits. Not feeling depressed.  Reports normal energy and interest. She has been maintaining normal biological functions. She is able to think clearly. She is able to focus on task. Her thoughts are not crowded or racing. No evidence of mania. No hallucination in any modality. She is not making any delusional statement. No passivity of will/thought. She is fully in touch with reality. No thoughts of suicide. No thoughts of homicide. No violent thoughts. No overwhelming anxiety. No substance use. No access to weapons. No family history of mental illness or suicide.    Demographic Factors:  NA  Loss Factors: Financial problems/change in socioeconomic status  Historical Factors: NA  Risk Reduction Factors:   Responsible for children under 81 years of age, Sense of responsibility to family, Employed, Living with another person, especially a relative, Positive social support, Positive therapeutic relationship and Positive coping skills or problem solving skills  Continued Clinical Symptoms:  As above   Cognitive Features That Contribute To Risk:  None    Suicide Risk:  Minimal: No identifiable suicidal ideation.  Patient is not having any thoughts of suicide at this time. Modifiable risk factors targeted during this admission includes adjustment disorder and depression. Demographical and historical risk factors cannot be modified. Patient is now engaging well. Patient is reliable and is future oriented. We have buffered patient's support structures. At this point, patient is at low risk of suicide. Patient is aware of the effects of psychoactive substances on decision making process. Patient has been provided with emergency contacts. Patient acknowledges to use resources provided if unforseen circumstances changes their current risk stratification.     Plan Of Care/Follow-up recommendations:  1. Continue current psychotropic medications 2. Mental health and addiction follow up as arranged.  3. Discharge in care of her  family 4. Provided limited quantity of prescriptions   [place], MD 08/12/2017, 9:52 AM

## 2017-08-31 ENCOUNTER — Ambulatory Visit (INDEPENDENT_AMBULATORY_CARE_PROVIDER_SITE_OTHER): Payer: Medicaid Other | Admitting: Advanced Practice Midwife

## 2017-08-31 VITALS — BP 134/73 | HR 70

## 2017-08-31 DIAGNOSIS — Z3049 Encounter for surveillance of other contraceptives: Secondary | ICD-10-CM

## 2017-08-31 DIAGNOSIS — Z975 Presence of (intrauterine) contraceptive device: Secondary | ICD-10-CM

## 2017-08-31 DIAGNOSIS — Z309 Encounter for contraceptive management, unspecified: Secondary | ICD-10-CM

## 2017-08-31 DIAGNOSIS — Z3009 Encounter for other general counseling and advice on contraception: Secondary | ICD-10-CM

## 2017-08-31 DIAGNOSIS — Z30430 Encounter for insertion of intrauterine contraceptive device: Secondary | ICD-10-CM

## 2017-08-31 DIAGNOSIS — Z3046 Encounter for surveillance of implantable subdermal contraceptive: Secondary | ICD-10-CM | POA: Diagnosis not present

## 2017-08-31 MED ORDER — LEVONORGESTREL 19.5 MCG/DAY IU IUD: INTRAUTERINE_SYSTEM | Freq: Once | INTRAUTERINE | Status: AC

## 2017-08-31 NOTE — Progress Notes (Signed)
Based on pt's screening score offered referral to Integrated Behavioral Health. Pt states she has counseling lined up and declined referral

## 2017-08-31 NOTE — Progress Notes (Signed)
At 1915 timeout done with Sharen CounterLisa Leftwich Kirby CNM and pt to verify correct pt and procedures

## 2017-08-31 NOTE — Progress Notes (Signed)
Here for nexplanon removal. Causing wt gain and periods wacky. Go 6 months without period and then bleed irregularly. Thinks causing headaches. Unsure of what will use for next birth control

## 2017-08-31 NOTE — Patient Instructions (Signed)

## 2017-08-31 NOTE — Progress Notes (Signed)
S: 22 y.o. G1P1001 presents to the office for Nexplanon removal. The device was placed 2 years ago after the birth of her son and she feels like it is causing her frequent headaches and weight gain. It is also causing irregular periods, with 4-6 months with no bleeding, then bleeding x 3 weeks.  She is unsure what she wants to do for birth control instead.  She has had trouble keeping up with pills in the past.  She denies any other problems/concerns.    O: BP 134/73   Pulse 70   LMP 08/07/2017 Comment: Pt is on birth control; implant in L arm   VS reviewed, nursing note reviewed,  Constitutional: well developed, well nourished, no distress HEENT: normocephalic CV: normal rate Pulm/chest wall: normal effort Abdomen: soft Neuro: alert and oriented x 3 Skin: warm, dry Psych: affect normal    Nexplanon Removal  Patient identified, informed consent performed, consent signed.   Patient does understand that irregular bleeding is a very common side effect of this medication. Appropriate time out taken at 7:15 pm. Nexplanon site identified. Area prepped in usual sterile fashon. One ml of 1% lidocaine was used to anesthetize the area at the distal end of the implant. A small stab incision was made right beside the implant on the distal portion. The Nexplanon rod was grasped using hemostats and removed with minimal difficulty. There was minimal blood loss. There were no complications.   A pressure bandage was applied to reduce any bruising.  The patient tolerated the procedure well and was given post procedure instructions.  Patient is planning to use IUD for contraception/attempt conception.  IUD Procedure Note Patient identified, informed consent performed.  Discussed risks of irregular bleeding, cramping, infection, malpositioning or misplacement of the IUD outside the uterus which may require further procedures. Time out was performed.  Urine pregnancy test negative.  Speculum placed in the  vagina.  Cervix visualized.  Cleaned with Betadine x 2.  Grasped anteriorly with a single tooth tenaculum.  Uterus sounded to 7 cm.  Liletta IUD placed per manufacturer's recommendations.  Strings trimmed to 4-5 cm. Attempt to cut strings to shorter length but difficult so will reevaluate length at string check.Tenaculum was removed, good hemostasis noted.  Patient tolerated procedure well.   Patient was given post-procedure instructions and the Liletta care card with expiration date.  Patient was also asked to check IUD strings periodically and follow up in 4-6 weeks for IUD check.    A/P: 1. Encounter for Nexplanon removal   2. Encounter for IUD insertion  - Levonorgestrel IUD  3. Encounter for general counseling and advice on contraceptive management --Discussed LARCs as most effective forms of birth control.  Discussed benefits/risks of other methods.  Pt desires Liletta IUD.   - Levonorgestrel IUD --String check in 4-6 weeks, consider cutting strings shorter PRN  Sharen CounterLisa Leftwich-Kirby, CNM 7:49 PM

## 2017-09-22 ENCOUNTER — Encounter: Payer: Self-pay | Admitting: *Deleted

## 2017-10-02 ENCOUNTER — Ambulatory Visit (INDEPENDENT_AMBULATORY_CARE_PROVIDER_SITE_OTHER): Payer: Medicaid Other | Admitting: Advanced Practice Midwife

## 2017-10-02 ENCOUNTER — Encounter: Payer: Self-pay | Admitting: Advanced Practice Midwife

## 2017-10-02 VITALS — BP 122/70 | HR 89 | Ht 66.0 in | Wt 260.0 lb

## 2017-10-02 DIAGNOSIS — Z975 Presence of (intrauterine) contraceptive device: Secondary | ICD-10-CM

## 2017-10-02 DIAGNOSIS — Z309 Encounter for contraceptive management, unspecified: Secondary | ICD-10-CM

## 2017-10-02 NOTE — Progress Notes (Signed)
  GYNECOLOGY CLINIC PROGRESS NOTE  History:  22 y.o. G1P1001 here today for today for IUD string check; Liletta IUD was placed  08/31/17. No complaints about the IUD, no concerning side effects.  The following portions of the patient's history were reviewed and updated as appropriate: allergies, current medications, past family history, past medical history, past social history, past surgical history and problem list.   Review of Systems:  Pertinent items are noted in HPI.   Objective:  Physical Exam Blood pressure 122/70, pulse 89, height 5\' 6"  (1.676 m), weight 260 lb (117.9 kg), last menstrual period 09/21/2017. Gen: NAD Abd: Soft, nontender and nondistended Pelvic: Normal appearing external genitalia; normal appearing vaginal mucosa and cervix.  IUD strings visualized, about 4 cm in length outside cervix.   Assessment & Plan:  Normal IUD check. Patient to keep IUD in place for five years; can come in for removal if she desires pregnancy within the next five years. Routine preventative health maintenance measures emphasized.  Sharen CounterLisa Leftwich-Kirby, CNM 5:08 PM

## 2019-08-10 ENCOUNTER — Other Ambulatory Visit: Payer: Self-pay

## 2019-08-10 ENCOUNTER — Ambulatory Visit: Payer: Medicaid Other | Attending: Internal Medicine

## 2019-08-10 DIAGNOSIS — Z20822 Contact with and (suspected) exposure to covid-19: Secondary | ICD-10-CM

## 2019-08-11 LAB — NOVEL CORONAVIRUS, NAA: SARS-CoV-2, NAA: NOT DETECTED

## 2019-08-31 ENCOUNTER — Other Ambulatory Visit: Payer: Medicaid Other

## 2019-09-09 ENCOUNTER — Ambulatory Visit: Payer: Medicaid Other | Attending: Internal Medicine

## 2019-09-09 DIAGNOSIS — Z20822 Contact with and (suspected) exposure to covid-19: Secondary | ICD-10-CM

## 2019-09-10 LAB — NOVEL CORONAVIRUS, NAA: SARS-CoV-2, NAA: NOT DETECTED

## 2019-11-28 ENCOUNTER — Ambulatory Visit: Payer: Medicaid Other | Attending: Internal Medicine

## 2019-11-28 DIAGNOSIS — Z20822 Contact with and (suspected) exposure to covid-19: Secondary | ICD-10-CM

## 2019-11-29 LAB — NOVEL CORONAVIRUS, NAA: SARS-CoV-2, NAA: NOT DETECTED

## 2019-11-29 LAB — SARS-COV-2, NAA 2 DAY TAT

## 2019-12-02 ENCOUNTER — Ambulatory Visit: Payer: Medicaid Other | Attending: Internal Medicine

## 2019-12-02 DIAGNOSIS — Z23 Encounter for immunization: Secondary | ICD-10-CM

## 2019-12-02 NOTE — Progress Notes (Signed)
   Covid-19 Vaccination Clinic  Name:  Sandy Smith    MRN: [id number] DOB: 10/29/1995  12/02/2019  [name] was observed post Covid-19 immunization for 15 minutes without incident. She was provided with Vaccine Information Sheet and instruction to access the V-Safe system.   [name] was instructed to call 911 with any severe reactions post vaccine: Marland Kitchen Difficulty breathing  . Swelling of face and throat  . A fast heartbeat  . A bad rash all over body  . Dizziness and weakness   Immunizations Administered    Name Date Dose VIS Date Route   Pfizer COVID-19 Vaccine 12/02/2019  1:25 PM 0.3 mL 08/05/2019 Intramuscular   Manufacturer: [employer], [employer]   Lot: [id number]   NDC: [address]-4

## 2019-12-26 ENCOUNTER — Ambulatory Visit: Payer: Self-pay

## 2019-12-26 ENCOUNTER — Ambulatory Visit: Payer: Medicaid Other | Attending: Internal Medicine

## 2019-12-26 DIAGNOSIS — Z23 Encounter for immunization: Secondary | ICD-10-CM

## 2019-12-26 NOTE — Progress Notes (Signed)
   Covid-19 Vaccination Clinic  Name:  Sandy Smith    MRN: [id number] DOB: [birth year]  12/26/2019  [name] was observed post Covid-19 immunization for 15 minutes without incident. She was provided with Vaccine Information Sheet and instruction to access the V-Safe system.   [name] was instructed to call 911 with any severe reactions post vaccine: Marland Kitchen Difficulty breathing  . Swelling of face and throat  . A fast heartbeat  . A bad rash all over body  . Dizziness and weakness   Immunizations Administered    Name Date Dose VIS Date Route   Pfizer COVID-19 Vaccine 12/26/2019  8:45 AM 0.3 mL 10/19/2018 Intramuscular   Manufacturer: [employer], [employer]   Lot: [id number]   NDC: [address]-4

## 2020-01-18 ENCOUNTER — Telehealth: Payer: Self-pay | Admitting: *Deleted

## 2020-01-18 ENCOUNTER — Encounter: Payer: Self-pay | Admitting: *Deleted

## 2020-01-18 NOTE — Telephone Encounter (Signed)
Pt left VM message stating she had IUD placed 2 years ago and wants to know the name of the IUD (Brand).  Call returned to pt and she was informed the she has a Liletta IUD placed on 08/31/17. She voiced understanding.

## 2020-08-25 ENCOUNTER — Other Ambulatory Visit: Payer: Self-pay

## 2020-08-25 ENCOUNTER — Ambulatory Visit
Admission: EM | Admit: 2020-08-25 | Discharge: 2020-08-25 | Disposition: A | Discharge status: Home / Self Care | Payer: 59 | Attending: Emergency Medicine | Admitting: Emergency Medicine

## 2020-08-25 DIAGNOSIS — J029 Acute pharyngitis, unspecified: Secondary | ICD-10-CM | POA: Diagnosis present

## 2020-08-25 DIAGNOSIS — R059 Cough, unspecified: Secondary | ICD-10-CM | POA: Diagnosis not present

## 2020-08-25 LAB — POCT RAPID STREP A (OFFICE): Rapid Strep A Screen: NEGATIVE

## 2020-08-25 MED ORDER — BENZONATATE 100 MG PO CAPS: 100.0000 mg | ORAL_CAPSULE | Freq: Three times a day (TID) | ORAL | 0 refills | Status: DC

## 2020-08-25 MED ORDER — CETIRIZINE HCL 10 MG PO TABS: 10.0000 mg | ORAL_TABLET | Freq: Every day | ORAL | 0 refills | Status: DC

## 2020-08-25 MED ORDER — FLUTICASONE PROPIONATE 50 MCG/ACT NA SUSP: 1.0000 | Freq: Every day | NASAL | 0 refills | Status: DC

## 2020-08-25 NOTE — ED Triage Notes (Signed)
Patient states she has had a sore throat and cough since the 13th of last month. Pt also states she had taken a covid test about 2 weeks after symptoms starting with negative results. Pt is aox4 and ambulatory.

## 2020-08-25 NOTE — Discharge Instructions (Signed)

## 2020-08-25 NOTE — ED Provider Notes (Signed)
[facility]    CSN: [id number] Arrival date & time: 08/25/20  1338      History   Chief Complaint Chief Complaint  Patient presents with  . Sore Throat    Since 13th  . Cough    Since 13th    HPI Sandy Smith is a 25 y.o. female  Presenting for sore throat, dry cough since 12/13.  Denies chest pain, difficulty breathing.  Took a home Covid test 2 weeks after symptoms started with negative results.  Requesting testing today.  Past Medical History:  Diagnosis Date  . Asthma   . Carpal tunnel syndrome     Patient Active Problem List   Diagnosis Date Noted  . IUD (intrauterine device) in place 08/31/2017  . Major depressive disorder, single episode, severe without psychotic features ([place]) 08/08/2017  . Severe recurrent major depression without psychotic features ([place]) 08/08/2017    Past Surgical History:  Procedure Laterality Date  . WISDOM TOOTH EXTRACTION      OB History    Gravida  1   Para  1   Term  1   Preterm      AB      Living  1     SAB      IAB      Ectopic      Multiple  0   Live Births  1            Home Medications    Prior to Admission medications   Medication Sig Start Date End Date Taking? Authorizing Provider  benzonatate (TESSALON) 100 MG capsule Take 1 capsule (100 mg total) by mouth every 8 (eight) hours. 08/25/20  Yes Hall-Potvin, [place], PA-C  cetirizine (ZYRTEC ALLERGY) 10 MG tablet Take 1 tablet (10 mg total) by mouth daily. 08/25/20  Yes Hall-Potvin, [place], PA-C  fluticasone (FLONASE) 50 MCG/ACT nasal spray Place 1 spray into both nostrils daily. 08/25/20  Yes Hall-Potvin, [place], PA-C  ibuprofen (ADVIL,MOTRIN) 400 MG tablet Take 400 mg by mouth every 6 (six) hours as needed.   Yes [provider]  escitalopram (LEXAPRO) 10 MG tablet Take 1 tablet (10 mg total) by mouth daily. For mood control Patient not taking: No sig reported 08/13/17   Money, Darnelle Maffucci B, FNP  hydrOXYzine (ATARAX/VISTARIL)  25 MG tablet Take 1 tablet (25 mg total) by mouth 3 (three) times daily as needed for anxiety. 08/12/17   Money, Lowry Ram, FNP  traZODone (DESYREL) 50 MG tablet Take 1 tablet (50 mg total) by mouth at bedtime as needed for sleep. 08/12/17   Money, Lowry Ram, FNP    Family History History reviewed. No pertinent family history.  Social History Social History   Tobacco Use  . Smoking status: Never Smoker  . Smokeless tobacco: Never Used  Vaping Use  . Vaping Use: Never used  Substance Use Topics  . Alcohol use: No  . Drug use: No     Allergies   Shellfish allergy   Review of Systems Review of Systems  Constitutional: Negative for fatigue and fever.  HENT: Positive for congestion and sore throat. Negative for dental problem, ear pain, facial swelling, hearing loss, sinus pain, trouble swallowing and voice change.   Eyes: Negative for photophobia, pain and visual disturbance.  Respiratory: Positive for cough. Negative for shortness of breath.   Cardiovascular: Negative for chest pain and palpitations.  Gastrointestinal: Negative for diarrhea and vomiting.  Musculoskeletal: Negative for arthralgias and myalgias.  Neurological: Negative for dizziness and  headaches.     Physical Exam Triage Vital Signs ED Triage Vitals  Enc Vitals Group     BP 08/25/20 1535 139/83     Pulse Rate 08/25/20 1535 (!) 106     Resp 08/25/20 1535 18     Temp 08/25/20 1535 98.2 F (36.8 C)     Temp Source 08/25/20 1535 Oral     SpO2 08/25/20 1535 96 %     Weight --      Height --      Head Circumference --      Peak Flow --      Pain Score 08/25/20 1453 5     Pain Loc --      Pain Edu? --      Excl. in GC? --    No data found.  Updated Vital Signs BP 139/83 (BP Location: Left Arm)   Pulse (!) 106   Temp 98.2 F (36.8 C) (Oral)   Resp 18   SpO2 96%   Visual Acuity Right Eye Distance:   Left Eye Distance:   Bilateral Distance:    Right Eye Near:   Left Eye Near:    Bilateral  Near:     Physical Exam Constitutional:      General: She is not in acute distress.    Appearance: She is not ill-appearing or diaphoretic.  HENT:     Head: Normocephalic and atraumatic.     Right Ear: Tympanic membrane and ear canal normal.     Left Ear: Tympanic membrane and ear canal normal.     Mouth/Throat:     Mouth: Mucous membranes are moist.     Pharynx: Oropharynx is clear. No oropharyngeal exudate or posterior oropharyngeal erythema.  Eyes:     General: No scleral icterus.    Conjunctiva/sclera: Conjunctivae normal.     Pupils: Pupils are equal, round, and reactive to light.  Neck:     Comments: Trachea midline, negative JVD Cardiovascular:     Rate and Rhythm: Normal rate and regular rhythm.     Heart sounds: No murmur heard. No gallop.   Pulmonary:     Effort: Pulmonary effort is normal. No respiratory distress.     Breath sounds: No wheezing, rhonchi or rales.  Musculoskeletal:     Cervical back: Neck supple. No tenderness.  Lymphadenopathy:     Cervical: No cervical adenopathy.  Skin:    Capillary Refill: Capillary refill takes less than 2 seconds.     Coloration: Skin is not jaundiced or pale.     Findings: No rash.  Neurological:     General: No focal deficit present.     Mental Status: She is alert and oriented to person, place, and time.      UC Treatments / Results  Labs (all labs ordered are listed, but only abnormal results are displayed) Labs Reviewed  NOVEL CORONAVIRUS, NAA  CULTURE, GROUP A STREP [facility])  POCT RAPID STREP A (OFFICE)    EKG   Radiology No results found.  Procedures Procedures (including critical care time)  Medications Ordered in UC Medications - No data to display  Initial Impression / Assessment and Plan / UC Course  I have reviewed the triage vital signs and the nursing notes.  Pertinent labs & imaging results that were available during my care of the patient were reviewed by me and considered in my medical  decision making (see chart for details).     Patient afebrile, nontoxic, with SpO2 96%.  Covid PCR pending.  Patient to quarantine until results are back.  We will treat supportively as outlined below.  Return precautions discussed, patient verbalized understanding and is agreeable to plan. Final Clinical Impressions(s) / UC Diagnoses   Final diagnoses:  Cough  Sore throat     Discharge Instructions     Tessalon for cough. Start flonase, atrovent nasal spray for nasal congestion/drainage. You can use over the counter nasal saline rinse such as neti pot for nasal congestion. Keep hydrated, your urine should be clear to pale yellow in color. Tylenol/motrin for fever and pain. Monitor for any worsening of symptoms, chest pain, shortness of breath, wheezing, swelling of the throat, go to the emergency department for further evaluation needed.     ED Prescriptions    Medication Sig Dispense Auth. Provider   benzonatate (TESSALON) 100 MG capsule Take 1 capsule (100 mg total) by mouth every 8 (eight) hours. 21 capsule Hall-Potvin, [place], PA-C   cetirizine (ZYRTEC ALLERGY) 10 MG tablet Take 1 tablet (10 mg total) by mouth daily. 30 tablet Hall-Potvin, [place], PA-C   fluticasone (FLONASE) 50 MCG/ACT nasal spray Place 1 spray into both nostrils daily. 16 g Hall-Potvin, [place], PA-C     PDMP not reviewed this encounter.   Hall-Potvin, [place], [place] 08/25/20 1644

## 2020-08-27 LAB — CULTURE, GROUP A STREP (THRC)

## 2020-08-28 ENCOUNTER — Telehealth: Payer: Self-pay | Admitting: Emergency Medicine

## 2020-08-28 LAB — NOVEL CORONAVIRUS, NAA: SARS-CoV-2, NAA: NOT DETECTED

## 2020-08-28 MED ORDER — PENICILLIN V POTASSIUM 500 MG PO TABS: 500.0000 mg | ORAL_TABLET | Freq: Two times a day (BID) | ORAL | 0 refills | Status: AC

## 2020-12-04 ENCOUNTER — Other Ambulatory Visit: Payer: Self-pay

## 2020-12-04 ENCOUNTER — Encounter: Payer: Self-pay | Admitting: Family Medicine

## 2020-12-04 ENCOUNTER — Ambulatory Visit (INDEPENDENT_AMBULATORY_CARE_PROVIDER_SITE_OTHER): Payer: 59 | Admitting: Family Medicine

## 2020-12-04 DIAGNOSIS — R202 Paresthesia of skin: Secondary | ICD-10-CM | POA: Diagnosis not present

## 2020-12-04 DIAGNOSIS — R200 Anesthesia of skin: Secondary | ICD-10-CM | POA: Diagnosis not present

## 2020-12-04 DIAGNOSIS — M25531 Pain in right wrist: Secondary | ICD-10-CM | POA: Diagnosis not present

## 2020-12-04 MED ORDER — PREDNISONE 10 MG PO TABS
ORAL_TABLET | ORAL | 0 refills | Status: DC
Start: 2020-12-04 — End: 2021-05-07

## 2020-12-04 NOTE — Progress Notes (Signed)
   Office Visit Note   Patient: Sandy Smith           Date of Birth: [birth year]           MRN: [id number] Visit Date: 12/04/2020 Requested by: Irena Reichmann, DO [address] [place],  [place] [address] PCP: Irena Reichmann, DO  Subjective: Chief Complaint  Patient presents with  . Right Wrist - Pain    Pain and popping in the right wrist for 6 weeks, worse over the last couple of weeks. NKI. Occasional numbness in the palm and fingers of the hand. Had CTS bilaterally during her pregnancy in 2016. Right-hand dominant.    HPI: She is here with right wrist pain and popping.  Symptoms started about 6 weeks ago, no injury.  Intermittent numbness and tingling in her fingers.  She has a history of carpal tunnel syndrome during pregnancy in 2016 which eventually resolved with prednisone.  She has not had any troubles until recently.  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  She is right-hand dominant.  She is not taking any medication for symptoms.              ROS:   All other systems were reviewed and are negative.  Objective: Vital Signs: There were no vitals taken for this visit.  Physical Exam:  General:  Alert and oriented, in no acute distress. Pulm:  Breathing unlabored. Psy:  Normal mood, congruent affect  Right wrist: She has no joint effusion.  Good range of motion of her wrist.  There is palpable popping on the dorsum of the wrist which seems to be coming from the extensor tendons.  She has a positive Tinel's at the right carpal tunnel, negative on the left.  Phalen's test is positive but does not completely reproduce her symptoms.  Intrinsic hand strength is normal and she has no muscle atrophy.  Imaging: No results found.  Assessment & Plan: 1.  Right wrist pain and popping, etiology  uncertain.  I think she has at least a component of carpal tunnel syndrome. -We will try carpal tunnel night splint, prednisone.  If symptoms persist, then x-rays and possibly referral to hand therapy.     Procedures: No procedures performed        PMFS History: Patient Active Problem List   Diagnosis Date Noted  . IUD (intrauterine device) in place 08/31/2017  . Major depressive disorder, single episode, severe without psychotic features ([facility]) 08/08/2017  . Severe recurrent major depression without psychotic features ([facility]) 08/08/2017   Past Medical History:  Diagnosis Date  . Asthma   . Carpal tunnel syndrome     History reviewed. No pertinent family history.  Past Surgical History:  Procedure Laterality Date  . WISDOM TOOTH EXTRACTION     Social History   Occupational History  . Not on file  Tobacco Use  . Smoking status: Never Smoker  . Smokeless tobacco: Never Used  Vaping Use  . Vaping Use: Never used  Substance and Sexual Activity  . Alcohol use: No  . Drug use: No  . Sexual activity: Never    Birth control/protection: Implant

## 2020-12-25 ENCOUNTER — Telehealth: Payer: Self-pay | Admitting: Family Medicine

## 2020-12-25 DIAGNOSIS — M25531 Pain in right wrist: Secondary | ICD-10-CM

## 2020-12-25 NOTE — Telephone Encounter (Signed)
X-Rays ordered at [facility].  Referral made to hand therapy (not hand surgery).

## 2020-12-25 NOTE — Telephone Encounter (Signed)
I called the patient and advised her of the plan. OT will call her to set up an appointment. She can walk in to [facility] for the xrays. The results will likely be sent to her through MyChart.

## 2020-12-25 NOTE — Telephone Encounter (Signed)
Patient called requesting a call back from Terri or Dr. Prince Rome. Patient states that medication prescribed is not working. Pain is still severe. Patient is asking for medical advice. Please call patient at [phone].

## 2020-12-25 NOTE — Telephone Encounter (Signed)
Your ov note said next step was xrays and possible referral to hand surgery. Should she come back in for f/u ov and xrays? Please advise.

## 2020-12-26 ENCOUNTER — Ambulatory Visit
Admission: RE | Admit: 2020-12-26 | Discharge: 2020-12-26 | Disposition: A | Discharge status: Home / Self Care | Payer: 59 | Source: Ambulatory Visit | Attending: Family Medicine | Admitting: Family Medicine

## 2020-12-26 ENCOUNTER — Other Ambulatory Visit: Payer: Self-pay

## 2020-12-26 DIAGNOSIS — M25531 Pain in right wrist: Secondary | ICD-10-CM

## 2020-12-31 ENCOUNTER — Telehealth: Payer: Self-pay | Admitting: Family Medicine

## 2020-12-31 NOTE — Telephone Encounter (Signed)
Patient called. Would like Terri to call her. [phone]. Did not know if she should keep the appointment tomorrow or not.

## 2020-12-31 NOTE — Telephone Encounter (Signed)
I called the patient. She was able to get an appointment for tomorrow afternoon with OT. Cancelled the office visit with Dr. Prince Rome. She will let us know if she fails to improve or if she worsens.

## 2021-01-01 ENCOUNTER — Ambulatory Visit: Payer: 59 | Admitting: Family Medicine

## 2021-01-01 ENCOUNTER — Other Ambulatory Visit: Payer: Self-pay

## 2021-01-01 ENCOUNTER — Ambulatory Visit: Payer: 59 | Attending: Family Medicine | Admitting: Occupational Therapy

## 2021-01-01 ENCOUNTER — Telehealth: Payer: Self-pay | Admitting: Family Medicine

## 2021-01-01 ENCOUNTER — Encounter: Payer: Self-pay | Admitting: Occupational Therapy

## 2021-01-01 DIAGNOSIS — M6281 Muscle weakness (generalized): Secondary | ICD-10-CM | POA: Insufficient documentation

## 2021-01-01 DIAGNOSIS — M25531 Pain in right wrist: Secondary | ICD-10-CM | POA: Insufficient documentation

## 2021-01-01 DIAGNOSIS — R208 Other disturbances of skin sensation: Secondary | ICD-10-CM | POA: Insufficient documentation

## 2021-01-01 NOTE — Patient Instructions (Signed)
11. Grip Strengthening (Resistive Putty)   Squeeze putty using thumb and all fingers. Repeat 15 times. Do 1-2 sessions per day.   Extension (Assistive Putty)   Roll putty back and forth, being sure to use all fingertips. Repeat 3 times. Do 1-2 sessions per day.  Then pinch as below.   Palmar Pinch Strengthening (Resistive Putty)   Pinch putty between thumb and each fingertip in turn after rolling out                FINGERS: Extension (Putty)    Open hand and fingers to flatten putty. 5 reps per set, 1-2 sets per day, 5 days per week      FINGERS: Intrinsic (Putty)    Flatten putty by separating fingers.

## 2021-01-01 NOTE — Telephone Encounter (Signed)
Wrist x-rays show normal-appearing bone structure.  Proceed with OT as scheduled.

## 2021-01-01 NOTE — Therapy (Signed)
[facility] [address] [place], [place], [address] Phone: [phone]   Fax:  [phone]  Occupational Therapy Evaluation  Patient Details  Name: Sandy Smith MRN: [id number] Date of Birth: 1995/10/01 Referring Provider (OT): Lavada Mesi   Encounter Date: 01/01/2021   OT End of Session - 01/01/21 1851    Visit Number 1    Number of Visits 8    Date for OT Re-Evaluation 03/17/21    Authorization Type [employer]    OT Start Time 1530    OT Stop Time 1615    OT Time Calculation (min) 45 min    Activity Tolerance Patient tolerated treatment well    Behavior During Therapy [facility] for tasks assessed/performed           Past Medical History:  Diagnosis Date  . Asthma   . Carpal tunnel syndrome     Past Surgical History:  Procedure Laterality Date  . WISDOM TOOTH EXTRACTION      There were no vitals filed for this visit.   Subjective Assessment - 01/01/21 1534    Subjective  Sharp pain in volar and dorsal aspect of forearm, pain in wrist with rotation    Currently in Pain? Yes    Pain Score 2     Pain Location Wrist    Pain Orientation Right    Pain Descriptors / Indicators Aching    Pain Type Chronic pain    Pain Onset More than a month ago    Pain Frequency Intermittent    Aggravating Factors  rotational movement at wrist    Pain Relieving Factors nothing that she is aware of             [facility] OT Assessment - 01/01/21 0001      Assessment   Medical Diagnosis Right wrist pain    Referring Provider (OT) Casimiro Needle Hilts    Onset Date/Surgical Date 12/04/20    Hand Dominance Right    Prior Therapy NA      Prior Function   Level of Independence Independent with basic ADLs    Vocation Full time employment    Vocation Requirements Play with children    Leisure Read, listen to music, watch movies, paint      ADL   Eating/Feeding Independent    Grooming Independent    Upper Body Bathing  Independent    Lower Body Bathing Independent    Upper Body Dressing Independent    Lower Body Dressing Independent    Toilet Transfer Independent    ADL comments Able to do all ADL's/IADL's but has pain with any activity which requires wrist rotation, or resistive flex/ext      Written Expression   Dominant Hand Right    Handwriting 100% legible      Observation/Other Assessments   Focus on Therapeutic Outcomes (FOTO)  NA      Sensation   Light Touch Appears Intact   to testing, but reports tingling in middle of palm and fingertips   Additional Comments Reports tingling in fingers - worse when wearing brace at night      Coordination   Gross Motor Movements are Fluid and Coordinated Yes    Fine Motor Movements are Fluid and Coordinated Yes    9 Hole Peg Test Right;Left    Right 9 Hole Peg Test 22.87    Left 9 Hole Peg Test 23.97      ROM / Strength   AROM / PROM /  Strength AROM;Strength      AROM   Overall AROM  Within functional limits for tasks performed    Overall AROM Comments shoulder, elbow, forearm      Strength   Overall Strength Within functional limits for tasks performed   shoulder and elbow   Overall Strength Comments shoulder and elbow      Hand Function   Right Hand Gross Grasp Impaired    Right Hand Grip (lbs) 30    Right Hand Lateral Pinch 20 lbs    Right Hand 3 Point Pinch 14 lbs    Left Hand Gross Grasp Impaired    Left Hand Grip (lbs) 42    Left Hand Lateral Pinch 20 lbs    Left 3 point pinch 14 lbs                           OT Education - 01/01/21 1850    Education Details red putty    Person(s) Educated Patient    Methods Explanation;Demonstration;Verbal cues;Handout    Comprehension Verbalized understanding;Returned demonstration            OT Short Term Goals - 01/01/21 1857      OT SHORT TERM GOAL #1   Title Patient will complete HEP designed to improve grip strength    Baseline No HEP    Time 4    Period Weeks     Status New    Target Date 02/15/21      OT SHORT TERM GOAL #2   Title Assess effectiveness and fit of current night splint    Time 4    Period Weeks    Status New             OT Long Term Goals - 01/01/21 1858      OT LONG TERM GOAL #1   Title Patient will complete an HEP designed to improve range of motion, grip strength and overall UE conditioning    Baseline No HEP    Time 8    Period Weeks    Status New    Target Date 03/17/21      OT LONG TERM GOAL #2   Title Assess need for custom splint and fabricate if warranted    Time 8    Period Weeks    Status New      OT LONG TERM GOAL #3   Title Patient will report no pain in wrist with exercise program for ROM and hand strengthening    Time 8    Period Weeks    Status New      OT LONG TERM GOAL #4   Title Patient will demonstrate at least a 5 lb increase in grip strength in RUE    Time 8    Period Weeks    Status New                 Plan - 01/01/21 1851    Clinical Impression Statement Patient is a 25 yr old woman referred for right wrist pain. Per recent MD note, patient has some level of carpal tunnel syndrome, but this is not only factor for her wrist pain.  Patient reports pain with any resistive movement, or rotational movement of dominant right wrist.  Patient demonstrates joint laxity, as evidenced by clicking and popping.  Patient reports tingling in fingers, and pain that at times radiates upward on both dorsal and volar aspect of forearm to elbow.  Patient may benefit from skilled OT intervention to address above and decrease pain and improve stability and strength in right hand/wrist.    OT Occupational Profile and History Problem Focused Assessment - Including review of records relating to presenting problem    Occupational performance deficits (Please refer to evaluation for details): Rest and Sleep;ADL's;IADL's    Body Structure / Function / Physical Skills ADL;Endurance;UE functional  use;Sensation;Decreased knowledge of precautions;Body mechanics;Decreased knowledge of use of DME;Flexibility;IADL;Pain;Strength;Dexterity;Edema;Mobility;ROM;Tone    Rehab Potential Fair    Clinical Decision Making Limited treatment options, no task modification necessary    Comorbidities Affecting Occupational Performance: None    Modification or Assistance to Complete Evaluation  No modification of tasks or assist necessary to complete eval    OT Frequency 1x / week    OT Duration 8 weeks    OT Treatment/Interventions Self-care/ADL training;Therapeutic exercise;DME and/or AE instruction;Splinting;Manual Therapy;Neuromuscular education;Ultrasound;Electrical Stimulation;Moist Heat;Iontophoresis;Contrast Bath;Passive range of motion;Therapeutic activities;Patient/family education    Plan Check putty exercises - teach wrist stretches, nerve glides RUE.  Address wrist pain, hand strengthening    OT Home Exercise Plan putty red    Consulted and Agree with Plan of Care Patient           Patient will benefit from skilled therapeutic intervention in order to improve the following deficits and impairments:   Body Structure / Function / Physical Skills: ADL,Endurance,UE functional use,Sensation,Decreased knowledge of precautions,Body mechanics,Decreased knowledge of use of DME,Flexibility,IADL,Pain,Strength,Dexterity,Edema,Mobility,ROM,Tone       Visit Diagnosis: Pain in right wrist - Plan: Ot plan of care cert/re-cert  Muscle weakness (generalized) - Plan: Ot plan of care cert/re-cert  Other disturbances of skin sensation - Plan: Ot plan of care cert/re-cert    Problem List Patient Active Problem List   Diagnosis Date Noted  . IUD (intrauterine device) in place 08/31/2017  . Major depressive disorder, single episode, severe without psychotic features (HCC) 08/08/2017  . Severe recurrent major depression without psychotic features (HCC) 08/08/2017    Collier Salina,  OTR/L 01/01/2021, 7:02 PM  [facility] [address] [place], [place], [address] Phone: [phone]   Fax:  [phone]  Name: Chanel Mcadams MRN: [id number] Date of Birth: 12-23-1995

## 2021-01-08 ENCOUNTER — Ambulatory Visit: Payer: 59 | Admitting: Occupational Therapy

## 2021-01-08 ENCOUNTER — Encounter: Payer: Self-pay | Admitting: Occupational Therapy

## 2021-01-08 ENCOUNTER — Other Ambulatory Visit: Payer: Self-pay

## 2021-01-08 DIAGNOSIS — M25531 Pain in right wrist: Secondary | ICD-10-CM

## 2021-01-08 DIAGNOSIS — R208 Other disturbances of skin sensation: Secondary | ICD-10-CM

## 2021-01-08 DIAGNOSIS — M6281 Muscle weakness (generalized): Secondary | ICD-10-CM

## 2021-01-08 NOTE — Therapy (Signed)
[facility] [address] [place], [place], [address] Phone: [phone]   Fax:  [phone]  Occupational Therapy Treatment  Patient Details  Name: Sandy Smith MRN: [id number] Date of Birth: March 10, 1996 Referring Provider (OT): Lavada Mesi   Encounter Date: 01/08/2021   OT End of Session - 01/08/21 1714    Visit Number 2    Number of Visits 8    Date for OT Re-Evaluation 03/17/21    Authorization Type [employer]    OT Start Time 1615    OT Stop Time 1700    OT Time Calculation (min) 45 min    Activity Tolerance Patient tolerated treatment well           Past Medical History:  Diagnosis Date  . Asthma   . Carpal tunnel syndrome     Past Surgical History:  Procedure Laterality Date  . WISDOM TOOTH EXTRACTION      There were no vitals filed for this visit.   Subjective Assessment - 01/08/21 1622    Subjective  Now I am noticing pain above my elbow.  Its weird    Currently in Pain? Yes    Pain Score 2     Pain Location Wrist    Pain Orientation Right    Pain Descriptors / Indicators Aching    Pain Type Chronic pain    Pain Radiating Towards from thumb to elbow    Pain Onset More than a month ago    Pain Frequency Intermittent    Aggravating Factors  thumb movement and wrist movement                        OT Treatments/Exercises (OP) - 01/08/21 0001      Modalities   Modalities Fluidotherapy      RUE Fluidotherapy   Number Minutes Fluidotherapy 10 Minutes    RUE Fluidotherapy Location Hand;Wrist;Forearm      Splinting   Splinting Patient brought in night splint.  Encouraged her to strap less tightly to see if less numbness.      Manual Therapy   Manual Therapy Soft tissue mobilization    Manual therapy comments Educated patient in wrist extension and elbow extension assisted stretch for RUE.    Soft tissue mobilization forearm - dense tissue - mildly edematous - 30 cm  right forearm vs 27 left just distal to elbow crease                  OT Education - 01/08/21 1713    Education Details Wrist extension / elbow extension assisted stretch to RUE    Person(s) Educated Patient    Methods Explanation;Demonstration;Verbal cues;Tactile cues    Comprehension Verbalized understanding;Returned demonstration            OT Short Term Goals - 01/08/21 1715      OT SHORT TERM GOAL #1   Title Patient will complete HEP designed to improve grip strength    Baseline No HEP    Time 4    Period Weeks    Status On-going    Target Date 02/15/21      OT SHORT TERM GOAL #2   Title Assess effectiveness and fit of current night splint    Time 4    Period Weeks    Status On-going             OT Long Term Goals - 01/08/21 1715  OT LONG TERM GOAL #1   Title Patient will complete an HEP designed to improve range of motion, grip strength and overall UE conditioning    Baseline No HEP    Time 8    Period Weeks    Status New      OT LONG TERM GOAL #2   Title Assess need for custom splint and fabricate if warranted    Time 8    Period Weeks    Status New      OT LONG TERM GOAL #3   Title Patient will report no pain in wrist with exercise program for ROM and hand strengthening    Time 8    Period Weeks    Status New      OT LONG TERM GOAL #4   Title Patient will demonstrate at least a 5 lb increase in grip strength in RUE    Time 8    Period Weeks    Status New                 Plan - 01/08/21 1714    Clinical Impression Statement Patient with report of pain proximal to elbow and in mid dorsal forearm.    OT Occupational Profile and History Problem Focused Assessment - Including review of records relating to presenting problem    Occupational performance deficits (Please refer to evaluation for details): Rest and Sleep;ADL's;IADL's    Body Structure / Function / Physical Skills ADL;Endurance;UE functional use;Sensation;Decreased  knowledge of precautions;Body mechanics;Decreased knowledge of use of DME;Flexibility;IADL;Pain;Strength;Dexterity;Edema;Mobility;ROM;Tone    Rehab Potential Fair    Clinical Decision Making Limited treatment options, no task modification necessary    Comorbidities Affecting Occupational Performance: None    Modification or Assistance to Complete Evaluation  No modification of tasks or assist necessary to complete eval    OT Frequency 1x / week    OT Duration 8 weeks    OT Treatment/Interventions Self-care/ADL training;Therapeutic exercise;DME and/or AE instruction;Splinting;Manual Therapy;Neuromuscular education;Ultrasound;Electrical Stimulation;Moist Heat;Iontophoresis;Contrast Bath;Passive range of motion;Therapeutic activities;Patient/family education    Plan Check putty exercises - teach wrist stretches, nerve glides RUE.  Address wrist pain, hand strengthening    OT Home Exercise Plan putty red    Consulted and Agree with Plan of Care Patient           Patient will benefit from skilled therapeutic intervention in order to improve the following deficits and impairments:   Body Structure / Function / Physical Skills: ADL,Endurance,UE functional use,Sensation,Decreased knowledge of precautions,Body mechanics,Decreased knowledge of use of DME,Flexibility,IADL,Pain,Strength,Dexterity,Edema,Mobility,ROM,Tone       Visit Diagnosis: Pain in right wrist  Muscle weakness (generalized)  Other disturbances of skin sensation    Problem List Patient Active Problem List   Diagnosis Date Noted  . IUD (intrauterine device) in place 08/31/2017  . Major depressive disorder, single episode, severe without psychotic features (HCC) 08/08/2017  . Severe recurrent major depression without psychotic features [facility]) 08/08/2017    Collier Salina 01/08/2021, 5:17 PM  [facility] [address] [place], [place], [address] Phone:  [phone]   Fax:  [phone]  Name: Sandy Smith MRN: [id number] Date of Birth: 10-Nov-1995

## 2021-01-15 ENCOUNTER — Encounter: Payer: Self-pay | Admitting: Occupational Therapy

## 2021-01-15 ENCOUNTER — Other Ambulatory Visit: Payer: Self-pay

## 2021-01-15 ENCOUNTER — Ambulatory Visit: Payer: 59 | Admitting: Occupational Therapy

## 2021-01-15 DIAGNOSIS — M25531 Pain in right wrist: Secondary | ICD-10-CM

## 2021-01-15 DIAGNOSIS — R208 Other disturbances of skin sensation: Secondary | ICD-10-CM

## 2021-01-15 DIAGNOSIS — M6281 Muscle weakness (generalized): Secondary | ICD-10-CM

## 2021-01-15 NOTE — Therapy (Signed)
[facility] [address] [place], [place], [address] Phone: [phone]   Fax:  [phone]  Occupational Therapy Treatment  Patient Details  Name: Sandy Smith MRN: [id number] Date of Birth: 1996-04-10 Referring Provider (OT): Lavada Mesi   Encounter Date: 01/15/2021   OT End of Session - 01/15/21 1616    Visit Number 3    Number of Visits 8    Date for OT Re-Evaluation 03/17/21    Authorization Type [employer]    OT Start Time 1617    OT Stop Time 1700    OT Time Calculation (min) 43 min    Activity Tolerance Patient tolerated treatment well           Past Medical History:  Diagnosis Date  . Asthma   . Carpal tunnel syndrome     Past Surgical History:  Procedure Laterality Date  . WISDOM TOOTH EXTRACTION      There were no vitals filed for this visit.   Subjective Assessment - 01/15/21 1617    Subjective  A little bit of pain.    Currently in Pain? Yes    Pain Score 3     Pain Location Wrist    Pain Orientation Right    Pain Descriptors / Indicators Aching    Pain Type Chronic pain    Pain Radiating Towards noticing it more in ring fingre    Pain Onset More than a month ago    Pain Frequency Constant               Fluidotherapy x 12 minutes for RUE to address pain, swelling, and stiffness. No adverse reactions.   Tendon Glides  For RUE   Forearm Gym with RUE for wrist mobilization   Hand Gripper: with RUE on level 2 with black spring. Pt picked up 1 inch blocks with gripper with no drops and little difficulty.                  OT Short Term Goals - 01/08/21 1715      OT SHORT TERM GOAL #1   Title Patient will complete HEP designed to improve grip strength    Baseline No HEP    Time 4    Period Weeks    Status On-going    Target Date 02/15/21      OT SHORT TERM GOAL #2   Title Assess effectiveness and fit of current night splint    Time 4    Period  Weeks    Status On-going             OT Long Term Goals - 01/08/21 1715      OT LONG TERM GOAL #1   Title Patient will complete an HEP designed to improve range of motion, grip strength and overall UE conditioning    Baseline No HEP    Time 8    Period Weeks    Status New      OT LONG TERM GOAL #2   Title Assess need for custom splint and fabricate if warranted    Time 8    Period Weeks    Status New      OT LONG TERM GOAL #3   Title Patient will report no pain in wrist with exercise program for ROM and hand strengthening    Time 8    Period Weeks    Status New      OT LONG TERM GOAL #4  Title Patient will demonstrate at least a 5 lb increase in grip strength in RUE    Time 8    Period Weeks    Status New                 Plan - 01/16/21 4098    Clinical Impression Statement 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 Pt responded well to treatment this day.    OT Occupational Profile and History Problem Focused Assessment - Including review of records relating to presenting problem    Occupational performance deficits (Please refer to evaluation for details): Rest and Sleep;ADL's;IADL's    Body Structure / Function / Physical Skills ADL;Endurance;UE functional use;Sensation;Decreased knowledge of precautions;Body mechanics;Decreased knowledge of use of DME;Flexibility;IADL;Pain;Strength;Dexterity;Edema;Mobility;ROM;Tone    Rehab Potential Fair    Clinical Decision Making Limited treatment options, no task modification necessary    Comorbidities Affecting Occupational Performance: None    Modification or Assistance to Complete Evaluation  No modification of tasks or assist necessary to complete eval    OT Frequency 1x / week    OT Duration 8 weeks    OT Treatment/Interventions Self-care/ADL training;Therapeutic exercise;DME and/or AE instruction;Splinting;Manual Therapy;Neuromuscular education;Ultrasound;Electrical  Stimulation;Moist Heat;Iontophoresis;Contrast Bath;Passive range of motion;Therapeutic activities;Patient/family education    Plan Check putty exercises - teach wrist stretches, nerve glides RUE.  Address wrist pain, hand strengthening    OT Home Exercise Plan putty red    Consulted and Agree with Plan of Care Patient           Patient will benefit from skilled therapeutic intervention in order to improve the following deficits and impairments:   Body Structure / Function / Physical Skills: ADL,Endurance,UE functional use,Sensation,Decreased knowledge of precautions,Body mechanics,Decreased knowledge of use of DME,Flexibility,IADL,Pain,Strength,Dexterity,Edema,Mobility,ROM,Tone       Visit Diagnosis: Pain in right wrist  Muscle weakness (generalized)  Other disturbances of skin sensation    Problem List Patient Active Problem List   Diagnosis Date Noted  . IUD (intrauterine device) in place 08/31/2017  . Major depressive disorder, single episode, severe without psychotic features (HCC) 08/08/2017  . Severe recurrent major depression without psychotic features [facility]) 08/08/2017    Junious Dresser MOT, OTR/L  01/16/2021, 8:18 AM  [facility] [address] [place], [place], [address] Phone: [phone]   Fax:  [phone]  Name: Sandy Smith MRN: [id number] Date of Birth: 09/26/1995

## 2021-01-22 ENCOUNTER — Encounter: Payer: Self-pay | Admitting: Occupational Therapy

## 2021-01-22 ENCOUNTER — Ambulatory Visit: Payer: 59 | Admitting: Occupational Therapy

## 2021-01-22 ENCOUNTER — Other Ambulatory Visit: Payer: Self-pay

## 2021-01-22 DIAGNOSIS — M6281 Muscle weakness (generalized): Secondary | ICD-10-CM

## 2021-01-22 DIAGNOSIS — M25531 Pain in right wrist: Secondary | ICD-10-CM | POA: Diagnosis not present

## 2021-01-22 DIAGNOSIS — R208 Other disturbances of skin sensation: Secondary | ICD-10-CM

## 2021-01-22 NOTE — Therapy (Signed)
[facility] [address] [place], [place], [address] Phone: [phone]   Fax:  [phone]  Occupational Therapy Treatment  Patient Details  Name: Sandy Smith MRN: [id number] Date of Birth: 1996/07/25 Referring Provider (OT): Lavada Mesi   Encounter Date: 01/22/2021   OT End of Session - 01/22/21 1655    Visit Number 4    Number of Visits 8    Date for OT Re-Evaluation 03/17/21    Authorization Type [employer]    OT Start Time 1615    OT Stop Time 1653    OT Time Calculation (min) 38 min    Equipment Utilized During Treatment 2lb dumb bell    Activity Tolerance Patient tolerated treatment well    Behavior During Therapy [facility] for tasks assessed/performed           Past Medical History:  Diagnosis Date  . Asthma   . Carpal tunnel syndrome     Past Surgical History:  Procedure Laterality Date  . WISDOM TOOTH EXTRACTION      There were no vitals filed for this visit.       [facility] OT Assessment - 01/22/21 0001      Hand Function   Right Hand Grip (lbs) 46.2    Left Hand Grip (lbs) 45                    OT Treatments/Exercises (OP) - 01/22/21 0001      ADLs   ADL Comments Patient with reported pain in left forearm both volarrly and dorsally.  Patient reports pain above right elbow- above lateral epicondyle.  Pain is inconsistent- discussed avoiding full elbow flexion when sleeping - wrapping towel at elbow.      Exercises   Exercises Wrist      Wrist Exercises   Bar Weights/Barbell (Wrist Flexion) 2 lbs    Wrist Flexion Limitations 10 reps, 3 sets    Bar Weights/Barbell (Wrist Extension) 2 lbs    Wrist Extension Limitations 10 reps, 3 sets    Bar Weights/Barbell (Radial Deviation) 2 lbs    Wrist Radial Deviation Limitations 10 reps, 3 sets                  OT Education - 01/22/21 1655    Education Details wrist strengthening 2lb extension, flexion, radial/ulnar     Person(s) Educated Patient    Methods Explanation;Demonstration    Comprehension Verbalized understanding;Returned demonstration            OT Short Term Goals - 01/22/21 1635      OT SHORT TERM GOAL #1   Title Patient will complete HEP designed to improve grip strength    Baseline No HEP    Period Weeks    Status Achieved      OT SHORT TERM GOAL #2   Title Assess effectiveness and fit of current night splint    Time 4    Period Weeks    Status Achieved             OT Long Term Goals - 01/22/21 1636      OT LONG TERM GOAL #1   Title Patient will complete an HEP designed to improve range of motion, grip strength and overall UE conditioning    Baseline No HEP    Time 8    Period Weeks    Status On-going      OT LONG TERM GOAL #2   Title Assess need  for custom splint and fabricate if warranted    Time 8    Period Weeks    Status On-going      OT LONG TERM GOAL #3   Title Patient will report no pain in wrist with exercise program for ROM and hand strengthening    Time 8    Period Weeks    Status Achieved      OT LONG TERM GOAL #4   Title Patient will demonstrate at least a 5 lb increase in grip strength in RUE    Time 8    Period Weeks    Status Achieved                 Plan - 01/22/21 1656    Clinical Impression Statement Pt with report of pain beyond right wrist pain, above right lateral epicondyle, left forearm - recommend determining more causative factors.    OT Occupational Profile and History Problem Focused Assessment - Including review of records relating to presenting problem    Occupational performance deficits (Please refer to evaluation for details): Rest and Sleep;ADL's;IADL's    Body Structure / Function / Physical Skills ADL;Endurance;UE functional use;Sensation;Decreased knowledge of precautions;Body mechanics;Decreased knowledge of use of DME;Flexibility;IADL;Pain;Strength;Dexterity;Edema;Mobility;ROM;Tone    Rehab Potential Fair     Clinical Decision Making Limited treatment options, no task modification necessary    Comorbidities Affecting Occupational Performance: None    Modification or Assistance to Complete Evaluation  No modification of tasks or assist necessary to complete eval    OT Frequency 1x / week    OT Duration 8 weeks    OT Treatment/Interventions Self-care/ADL training;Therapeutic exercise;DME and/or AE instruction;Splinting;Manual Therapy;Neuromuscular education;Ultrasound;Electrical Stimulation;Moist Heat;Iontophoresis;Contrast Bath;Passive range of motion;Therapeutic activities;Patient/family education    Plan Address wrist pain, hand strengthening    OT Home Exercise Plan putty red    Consulted and Agree with Plan of Care Patient           Patient will benefit from skilled therapeutic intervention in order to improve the following deficits and impairments:   Body Structure / Function / Physical Skills: ADL,Endurance,UE functional use,Sensation,Decreased knowledge of precautions,Body mechanics,Decreased knowledge of use of DME,Flexibility,IADL,Pain,Strength,Dexterity,Edema,Mobility,ROM,Tone       Visit Diagnosis: Pain in right wrist  Muscle weakness (generalized)  Other disturbances of skin sensation    Problem List Patient Active Problem List   Diagnosis Date Noted  . IUD (intrauterine device) in place 08/31/2017  . Major depressive disorder, single episode, severe without psychotic features (HCC) 08/08/2017  . Severe recurrent major depression without psychotic features (HCC) 08/08/2017    Collier Salina, OTR/L 01/22/2021, 4:58 PM  [facility] [address] [place], [place], [address] Phone: [phone]   Fax:  [phone]  Name: Sandy Smith MRN: [id number] Date of Birth: [birth year]

## 2021-01-29 ENCOUNTER — Ambulatory Visit: Payer: 59 | Admitting: Occupational Therapy

## 2021-01-30 ENCOUNTER — Ambulatory Visit: Payer: 59 | Attending: Family Medicine | Admitting: Occupational Therapy

## 2021-01-30 ENCOUNTER — Other Ambulatory Visit: Payer: Self-pay

## 2021-01-30 ENCOUNTER — Encounter: Payer: Self-pay | Admitting: Occupational Therapy

## 2021-01-30 DIAGNOSIS — M6281 Muscle weakness (generalized): Secondary | ICD-10-CM | POA: Insufficient documentation

## 2021-01-30 DIAGNOSIS — R208 Other disturbances of skin sensation: Secondary | ICD-10-CM | POA: Diagnosis present

## 2021-01-30 DIAGNOSIS — M25531 Pain in right wrist: Secondary | ICD-10-CM | POA: Diagnosis not present

## 2021-01-30 NOTE — Therapy (Signed)
[facility] [address] [place], [place], [address] Phone: [phone]   Fax:  [phone]  Occupational Therapy Treatment and Discharge Summary  Patient Details  Name: Sandy Smith MRN: [id number] Date of Birth: [birth year] Referring Provider (OT): Eunice Blase   Encounter Date: 01/30/2021   OT End of Session - 01/30/21 1726    Visit Number 5    Number of Visits 8    Date for OT Re-Evaluation 03/17/21    Authorization Type [employer]    OT Start Time 1500    OT Stop Time 1520    OT Time Calculation (min) 20 min           Past Medical History:  Diagnosis Date  . Asthma   . Carpal tunnel syndrome     Past Surgical History:  Procedure Laterality Date  . WISDOM TOOTH EXTRACTION      There were no vitals filed for this visit.   Subjective Assessment - 01/30/21 1716    Subjective  Patient reports pain after wrist exercises.  Described pain that lsated all day.  Long discussion that although numbness has imporved in right hand, therapy does not seem to be helping various arm pains.  Recommended she follow up with her MD to determine what might be causing pain in left arm and above right elbow.  Patient in agreement,    Currently in Pain? Yes    Pain Location Wrist    Pain Orientation Right    Pain Descriptors / Indicators Aching    Pain Type Chronic pain    Pain Radiating Towards along dorsal forearm    Pain Onset More than a month ago    Pain Frequency Constant    Aggravating Factors  use    Pain Relieving Factors nothing                        OT Treatments/Exercises (OP) - 01/30/21 0001      ADLs   ADL Comments Patient is not getting good results from therapy at this time.  Reports continued nerve pain, muscular pain in right forearm, elbow, and left forearm.  Have directed her back to MD, and have agreed to stop therapy at this time,                    OT Short Term  Goals - 01/30/21 1724      OT SHORT TERM GOAL #1   Title Patient will complete HEP designed to improve grip strength    Baseline No HEP    Period Weeks    Status Achieved      OT SHORT TERM GOAL #2   Title Assess effectiveness and fit of current night splint    Time 4    Period Weeks    Status Achieved             OT Long Term Goals - 01/30/21 1725      OT LONG TERM GOAL #1   Title Patient will complete an HEP designed to improve range of motion, grip strength and overall UE conditioning    Baseline No HEP    Time 8    Period Weeks    Status Not Met      OT LONG TERM GOAL #2   Title Assess need for custom splint and fabricate if warranted    Time 8    Period Weeks    Status Deferred  OT LONG TERM GOAL #3   Title Patient will report no pain in wrist with exercise program for ROM and hand strengthening    Time 8    Period Weeks    Status Not Met      OT LONG TERM GOAL #4   Title Patient will demonstrate at least a 5 lb increase in grip strength in RUE    Time 8    Period Weeks    Status Achieved                 Plan - 01/30/21 1724    Clinical Impression Statement Pt with report of pain beyond right wrist pain, above right lateral epicondyle, left forearm - recommend determining more causative factors.    OT Occupational Profile and History Problem Focused Assessment - Including review of records relating to presenting problem    Occupational performance deficits (Please refer to evaluation for details): Rest and Sleep;ADL's;IADL's    Body Structure / Function / Physical Skills ADL;Endurance;UE functional use;Sensation;Decreased knowledge of precautions;Body mechanics;Decreased knowledge of use of DME;Flexibility;IADL;Pain;Strength;Dexterity;Edema;Mobility;ROM;Tone    Rehab Potential Fair    Clinical Decision Making Limited treatment options, no task modification necessary    Comorbidities Affecting Occupational Performance: None    Modification or  Assistance to Complete Evaluation  No modification of tasks or assist necessary to complete eval    OT Frequency 1x / week    OT Duration 8 weeks    OT Treatment/Interventions Self-care/ADL training;Therapeutic exercise;DME and/or AE instruction;Splinting;Manual Therapy;Neuromuscular education;Ultrasound;Electrical Stimulation;Moist Heat;Iontophoresis;Contrast Bath;Passive range of motion;Therapeutic activities;Patient/family education    Plan discharge at this time    OT Home Exercise Plan putty red    Consulted and Agree with Plan of Care Patient           Patient will benefit from skilled therapeutic intervention in order to improve the following deficits and impairments:   Body Structure / Function / Physical Skills: ADL,Endurance,UE functional use,Sensation,Decreased knowledge of precautions,Body mechanics,Decreased knowledge of use of DME,Flexibility,IADL,Pain,Strength,Dexterity,Edema,Mobility,ROM,Tone       Visit Diagnosis: Pain in right wrist  Muscle weakness (generalized)  Other disturbances of skin sensation    Problem List Patient Active Problem List   Diagnosis Date Noted  . IUD (intrauterine device) in place 08/31/2017  . Major depressive disorder, single episode, severe without psychotic features ([place]) 08/08/2017  . Severe recurrent major depression without psychotic features ([place]) 08/08/2017   OCCUPATIONAL THERAPY DISCHARGE SUMMARY  Visits from Start of Care: 5  Current functional level related to goals / functional outcomes: Improved grip strength, improved numbness   Remaining deficits: Pain in distal UE's   Education / Equipment: HEP Plan: Patient agrees to discharge.  Patient goals were partially met. Patient is being discharged due to meeting the stated rehab goals.  ?????      Mariah Milling , OTR/L 01/30/2021, 5:26 PM  [facility] [address] [place], [place],  [address] Phone: [phone]   Fax:  [phone]  Name: Sandy Smith MRN: [id number] Date of Birth: 20-Apr-1996

## 2021-02-05 ENCOUNTER — Ambulatory Visit: Payer: 59 | Admitting: Occupational Therapy

## 2021-02-12 ENCOUNTER — Encounter: Payer: 59 | Admitting: Occupational Therapy

## 2021-02-19 ENCOUNTER — Encounter: Payer: 59 | Admitting: Occupational Therapy

## 2021-02-26 ENCOUNTER — Encounter: Payer: 59 | Admitting: Occupational Therapy

## 2021-05-07 ENCOUNTER — Other Ambulatory Visit: Payer: Self-pay

## 2021-05-07 ENCOUNTER — Ambulatory Visit
Admission: EM | Admit: 2021-05-07 | Discharge: 2021-05-07 | Disposition: A | Discharge status: Home / Self Care | Payer: 59 | Attending: Urgent Care | Admitting: Urgent Care

## 2021-05-07 DIAGNOSIS — H9202 Otalgia, left ear: Secondary | ICD-10-CM | POA: Diagnosis present

## 2021-05-07 DIAGNOSIS — J069 Acute upper respiratory infection, unspecified: Secondary | ICD-10-CM | POA: Diagnosis present

## 2021-05-07 DIAGNOSIS — R070 Pain in throat: Secondary | ICD-10-CM | POA: Insufficient documentation

## 2021-05-07 LAB — POCT RAPID STREP A (OFFICE): Rapid Strep A Screen: NEGATIVE

## 2021-05-07 MED ORDER — PSEUDOEPHEDRINE HCL 60 MG PO TABS: 60.0000 mg | ORAL_TABLET | Freq: Three times a day (TID) | ORAL | 0 refills | Status: AC | PRN

## 2021-05-07 MED ORDER — CETIRIZINE HCL 10 MG PO TABS
10.0000 mg | ORAL_TABLET | Freq: Every day | ORAL | 0 refills | Status: AC
Start: 2021-05-07 — End: ?

## 2021-05-07 NOTE — ED Provider Notes (Signed)
[facility]   MRN: [id number] DOB: 05/04/1996  Subjective:   Davanna He is a 25 y.o. female presenting for 1 week history of persistent left ear pain, throat pain.  Symptoms are mild to moderate in severity.  Had COVID vaccination and booster.  Denies fever, sinus pain, ear drainage, cough, chest pain, shortness of breath, wheezing.  She does work at hospital.  No history of smoking.  However, she does have a remote history of asthma.   Current Facility-Administered Medications:    Levonorgestrel IUD, , Intrauterine, Once, Leftwich-Kirby, Lisa A, CNM  Current Outpatient Medications:    hydrOXYzine (ATARAX/VISTARIL) 25 MG tablet, Take 1 tablet (25 mg total) by mouth 3 (three) times daily as needed for anxiety. (Patient not taking: Reported on 01/01/2021), Disp: 30 tablet, Rfl: 0   ibuprofen (ADVIL,MOTRIN) 400 MG tablet, Take 400 mg by mouth every 6 (six) hours as needed., Disp: , Rfl:    Allergies  Allergen Reactions   Shellfish Allergy Other (See Comments)    Unknown reaction: allergy discovered by allergy testing     Past Medical History:  Diagnosis Date   Asthma    Carpal tunnel syndrome      Past Surgical History:  Procedure Laterality Date   WISDOM TOOTH EXTRACTION      History reviewed. No pertinent family history.  Social History   Tobacco Use   Smoking status: Never   Smokeless tobacco: Never  Vaping Use   Vaping Use: Never used  Substance Use Topics   Alcohol use: No   Drug use: No    ROS   Objective:   Vitals: BP 126/81 (BP Location: Left Arm)   Pulse 99   Temp 99.1 F (37.3 C) (Oral)   Resp 18   SpO2 97%   Physical Exam Constitutional:      General: She is not in acute distress.    Appearance: She is well-developed. She is obese. She is not ill-appearing, toxic-appearing or diaphoretic.  HENT:     Head: Normocephalic and atraumatic.     Right Ear: Tympanic membrane and ear canal normal. No drainage or tenderness. No  middle ear effusion. Tympanic membrane is not erythematous.     Left Ear: Tympanic membrane and ear canal normal. No drainage or tenderness.  No middle ear effusion. Tympanic membrane is not erythematous.     Nose: No congestion or rhinorrhea.     Mouth/Throat:     Mouth: Mucous membranes are moist. No oral lesions.     Pharynx: Oropharynx is clear. No pharyngeal swelling, oropharyngeal exudate, posterior oropharyngeal erythema or uvula swelling.     Tonsils: No tonsillar exudate or tonsillar abscesses.  Eyes:     Extraocular Movements:     Right eye: Normal extraocular motion.     Left eye: Normal extraocular motion.     Conjunctiva/sclera: Conjunctivae normal.     Pupils: Pupils are equal, round, and reactive to light.  Cardiovascular:     Rate and Rhythm: Normal rate.  Pulmonary:     Effort: Pulmonary effort is normal.  Musculoskeletal:     Cervical back: Normal range of motion and neck supple.  Lymphadenopathy:     Cervical: No cervical adenopathy.  Skin:    General: Skin is warm and dry.  Neurological:     General: No focal deficit present.     Mental Status: She is alert and oriented to person, place, and time.  Psychiatric:        Mood and Affect:  Mood normal.        Behavior: Behavior normal.    Results for orders placed or performed during the hospital encounter of 05/07/21 (from the past 24 hour(s))  POCT rapid strep A     Status: None   Collection Time: 05/07/21  8:52 AM  Result Value Ref Range   Rapid Strep A Screen Negative Negative    Assessment and Plan :   PDMP not reviewed this encounter.  1. Viral URI   2. Throat pain   3. Left ear pain     Will manage for viral illness such as viral URI, viral syndrome, viral rhinitis, COVID-19, viral pharyngitis. Counseled patient on nature of COVID-19 including modes of transmission, diagnostic testing, management and supportive care.  Offered scripts for symptomatic relief. COVID 19 and strep culture are pending.  Counseled patient on potential for adverse effects with medications prescribed/recommended today, ER and return-to-clinic precautions discussed, patient verbalized understanding.     Wallis Bamberg, [place] 05/07/21 6712

## 2021-05-07 NOTE — Discharge Instructions (Signed)

## 2021-05-07 NOTE — ED Triage Notes (Signed)
Pt c/o lt ear pain and sore throat x1wk. Hx of strep.

## 2021-05-10 LAB — CULTURE, GROUP A STREP (THRC)

## 2021-10-02 DIAGNOSIS — H6692 Otitis media, unspecified, left ear: Secondary | ICD-10-CM | POA: Diagnosis not present

## 2022-06-24 ENCOUNTER — Ambulatory Visit
Admission: EM | Admit: 2022-06-24 | Discharge: 2022-06-24 | Disposition: A | Discharge status: Home / Self Care | Payer: BC Managed Care – PPO | Attending: Physician Assistant | Admitting: Physician Assistant

## 2022-06-24 DIAGNOSIS — R519 Headache, unspecified: Secondary | ICD-10-CM | POA: Diagnosis not present

## 2022-06-24 MED ORDER — IBUPROFEN 800 MG PO TABS
800.0000 mg | ORAL_TABLET | Freq: Once | ORAL | Status: AC
  Administered 2022-06-24: 800 mg via ORAL

## 2022-06-24 NOTE — Discharge Instructions (Signed)
Advised take ibuprofen or Tylenol as needed for headaches. Follow-up PCP or return to urgent care if symptoms fail to improve.
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
## 2022-07-13 ENCOUNTER — Ambulatory Visit
Admission: EM | Admit: 2022-07-13 | Discharge: 2022-07-13 | Disposition: A | Discharge status: Home / Self Care | Payer: BC Managed Care – PPO | Attending: Internal Medicine | Admitting: Internal Medicine

## 2022-07-13 DIAGNOSIS — R053 Chronic cough: Secondary | ICD-10-CM | POA: Diagnosis not present

## 2022-07-13 DIAGNOSIS — H65191 Other acute nonsuppurative otitis media, right ear: Secondary | ICD-10-CM | POA: Diagnosis not present

## 2022-07-13 DIAGNOSIS — J069 Acute upper respiratory infection, unspecified: Secondary | ICD-10-CM

## 2022-07-13 MED ORDER — BENZONATATE 100 MG PO CAPS: 100.0000 mg | ORAL_CAPSULE | Freq: Three times a day (TID) | ORAL | 0 refills | Status: DC | PRN

## 2022-07-13 MED ORDER — AMOXICILLIN-POT CLAVULANATE 875-125 MG PO TABS: 1.0000 | ORAL_TABLET | Freq: Two times a day (BID) | ORAL | 0 refills | Status: DC

## 2022-07-13 NOTE — ED Provider Notes (Signed)
[facility]    CSN: [id number] Arrival date & time: 07/13/22  0935      History   Chief Complaint Chief Complaint  Patient presents with   Otalgia    HPI Sandy Smith is a 26 y.o. female.   Patient presents with 2-week history of nasal congestion and cough.  Patient states that she developed new bilateral ear pain a few days ago.  Denies any known sick contacts or fevers at home.  She denies chest pain, shortness of breath, nausea, vomiting, diarrhea, abdominal pain.  Patient has been taking over-the-counter cold and flu medication with minimal improvement of symptoms.  She reports history of asthma but denies that she has been having to use inhaler.   Otalgia   Past Medical History:  Diagnosis Date   Asthma    Carpal tunnel syndrome     Patient Active Problem List   Diagnosis Date Noted   IUD (intrauterine device) in place 08/31/2017   Major depressive disorder, single episode, severe without psychotic features (HCC) 08/08/2017   Severe recurrent major depression without psychotic features (HCC) 08/08/2017    Past Surgical History:  Procedure Laterality Date   WISDOM TOOTH EXTRACTION      OB History     Gravida  1   Para  1   Term  1   Preterm      AB      Living  1      SAB      IAB      Ectopic      Multiple  0   Live Births  1            Home Medications    Prior to Admission medications   Medication Sig Start Date End Date Taking? Authorizing Provider  amoxicillin-clavulanate (AUGMENTIN) 875-125 MG tablet Take 1 tablet by mouth every 12 (twelve) hours. 07/13/22  Yes Nykira Reddix, Rolly Salter E, FNP  benzonatate (TESSALON) 100 MG capsule Take 1 capsule (100 mg total) by mouth every 8 (eight) hours as needed for cough. 07/13/22  Yes Rigoberto Repass, Acie Fredrickson, FNP  cetirizine (ZYRTEC ALLERGY) 10 MG tablet Take 1 tablet (10 mg total) by mouth daily. 05/07/21   Wallis Bamberg, PA-C  hydrOXYzine (ATARAX/VISTARIL) 25 MG tablet Take 1 tablet (25 mg  total) by mouth 3 (three) times daily as needed for anxiety. Patient not taking: Reported on 01/01/2021 08/12/17   Money, Gerlene Burdock, FNP  ibuprofen (ADVIL,MOTRIN) 400 MG tablet Take 400 mg by mouth every 6 (six) hours as needed.    [provider]  pseudoephedrine (SUDAFED) 60 MG tablet Take 1 tablet (60 mg total) by mouth every 8 (eight) hours as needed for congestion. 05/07/21   Wallis Bamberg, PA-C    Family History History reviewed. No pertinent family history.  Social History Social History   Tobacco Use   Smoking status: Never   Smokeless tobacco: Never  Vaping Use   Vaping Use: Never used  Substance Use Topics   Alcohol use: No   Drug use: No     Allergies   Shellfish allergy   Review of Systems Review of Systems Per HPI  Physical Exam Triage Vital Signs ED Triage Vitals  Enc Vitals Group     BP 07/13/22 0947 130/85     Pulse Rate 07/13/22 0947 (!) 106     Resp 07/13/22 0947 15     Temp 07/13/22 0947 98.6 F (37 C)     Temp Source 07/13/22 0947 Oral  SpO2 07/13/22 0947 97 %     Weight --      Height --      Head Circumference --      Peak Flow --      Pain Score 07/13/22 0946 3     Pain Loc --      Pain Edu? --      Excl. in GC? --    No data found.  Updated Vital Signs BP 130/85   Pulse (!) 106   Temp 98.6 F (37 C) (Oral)   Resp 15   LMP  (Within Years)   SpO2 97%   Visual Acuity Right Eye Distance:   Left Eye Distance:   Bilateral Distance:    Right Eye Near:   Left Eye Near:    Bilateral Near:     Physical Exam Constitutional:      General: She is not in acute distress.    Appearance: Normal appearance. She is not toxic-appearing or diaphoretic.  HENT:     Head: Normocephalic and atraumatic.     Right Ear: Ear canal normal. No drainage, swelling or tenderness. No middle ear effusion. There is no impacted cerumen. Tympanic membrane is erythematous. Tympanic membrane is not perforated or bulging.     Left Ear: Tympanic  membrane and ear canal normal. No drainage, swelling or tenderness.  No middle ear effusion. There is no impacted cerumen. Tympanic membrane is not perforated, erythematous or bulging.     Nose: Congestion present.     Mouth/Throat:     Mouth: Mucous membranes are moist.     Pharynx: No posterior oropharyngeal erythema.  Eyes:     Extraocular Movements: Extraocular movements intact.     Conjunctiva/sclera: Conjunctivae normal.     Pupils: Pupils are equal, round, and reactive to light.  Cardiovascular:     Rate and Rhythm: Normal rate and regular rhythm.     Pulses: Normal pulses.     Heart sounds: Normal heart sounds.  Pulmonary:     Effort: Pulmonary effort is normal. No respiratory distress.     Breath sounds: Normal breath sounds. No wheezing.  Abdominal:     General: Abdomen is flat. Bowel sounds are normal.     Palpations: Abdomen is soft.  Musculoskeletal:        General: Normal range of motion.     Cervical back: Normal range of motion.  Skin:    General: Skin is warm and dry.  Neurological:     General: No focal deficit present.     Mental Status: She is alert and oriented to person, place, and time. Mental status is at baseline.  Psychiatric:        Mood and Affect: Mood normal.        Behavior: Behavior normal.      UC Treatments / Results  Labs (all labs ordered are listed, but only abnormal results are displayed) Labs Reviewed - No data to display  EKG   Radiology No results found.  Procedures Procedures (including critical care time)  Medications Ordered in UC Medications - No data to display  Initial Impression / Assessment and Plan / UC Course  I have reviewed the triage vital signs and the nursing notes.  Pertinent labs & imaging results that were available during my care of the patient were reviewed by me and considered in my medical decision making (see chart for details).     Has persistent upper respiratory symptoms and cough.  There are  no adventitious lung sounds on exam and no signs of respiratory compromise so do not think that chest imaging is necessary.  Patient denies shortness of breath or need for use for albuterol inhaler so low suspicion for asthma exacerbation.  Patient does have right otitis media so will treat with Augmentin.  I do think that there is concern for secondary bacterial infection given duration of symptoms as well which Augmentin will also help treat.  Benzonatate prescribed to take as needed for cough.  Tachycardia noted on vital signs per triage but heart rate appears normal on physical exam.  No concern for this at this time.  Patient advised to follow-up if symptoms persist or worsen.  Patient verbalized understanding and was agreeable with plan. Final Clinical Impressions(s) / UC Diagnoses   Final diagnoses:  Other non-recurrent acute nonsuppurative otitis media of right ear  Acute upper respiratory infection  Persistent cough     Discharge Instructions      You have an ear infection and upper respiratory infection which is being treated with antibiotic.  Please take this with food.  Cough medication as prescribed.  Follow-up if symptoms persist or worsen.    ED Prescriptions     Medication Sig Dispense Auth. Provider   amoxicillin-clavulanate (AUGMENTIN) 875-125 MG tablet Take 1 tablet by mouth every 12 (twelve) hours. 14 tablet [place], [place] E, [place]   benzonatate (TESSALON) 100 MG capsule Take 1 capsule (100 mg total) by mouth every 8 (eight) hours as needed for cough. 21 capsule [place], Acie Fredrickson, [place]      PDMP not reviewed this encounter.   Gustavus Bryant, [place] 07/13/22 1150

## 2022-07-13 NOTE — ED Triage Notes (Signed)
Pt presents to [facility] with co of cold/ flu symptoms for 1 week and new onset of bilateral otalgia. Pt has been taking otc cold and flu medication.

## 2022-07-13 NOTE — Discharge Instructions (Signed)
You have an ear infection and upper respiratory infection which is being treated with antibiotic.  Please take this with food.  Cough medication as prescribed.  Follow-up if symptoms persist or worsen.

## 2022-09-19 DIAGNOSIS — Z113 Encounter for screening for infections with a predominantly sexual mode of transmission: Secondary | ICD-10-CM | POA: Diagnosis not present

## 2022-09-19 DIAGNOSIS — Z30432 Encounter for removal of intrauterine contraceptive device: Secondary | ICD-10-CM | POA: Diagnosis not present

## 2022-09-28 IMAGING — CR DG WRIST COMPLETE 3+V*R*
4 series · 4 of 4 positions shown · non-contrast
Comparison: None.

CLINICAL DATA: Right wrist pain without injury

EXAM:
RIGHT WRIST - COMPLETE 3+ VIEW

[x wrist pa right]
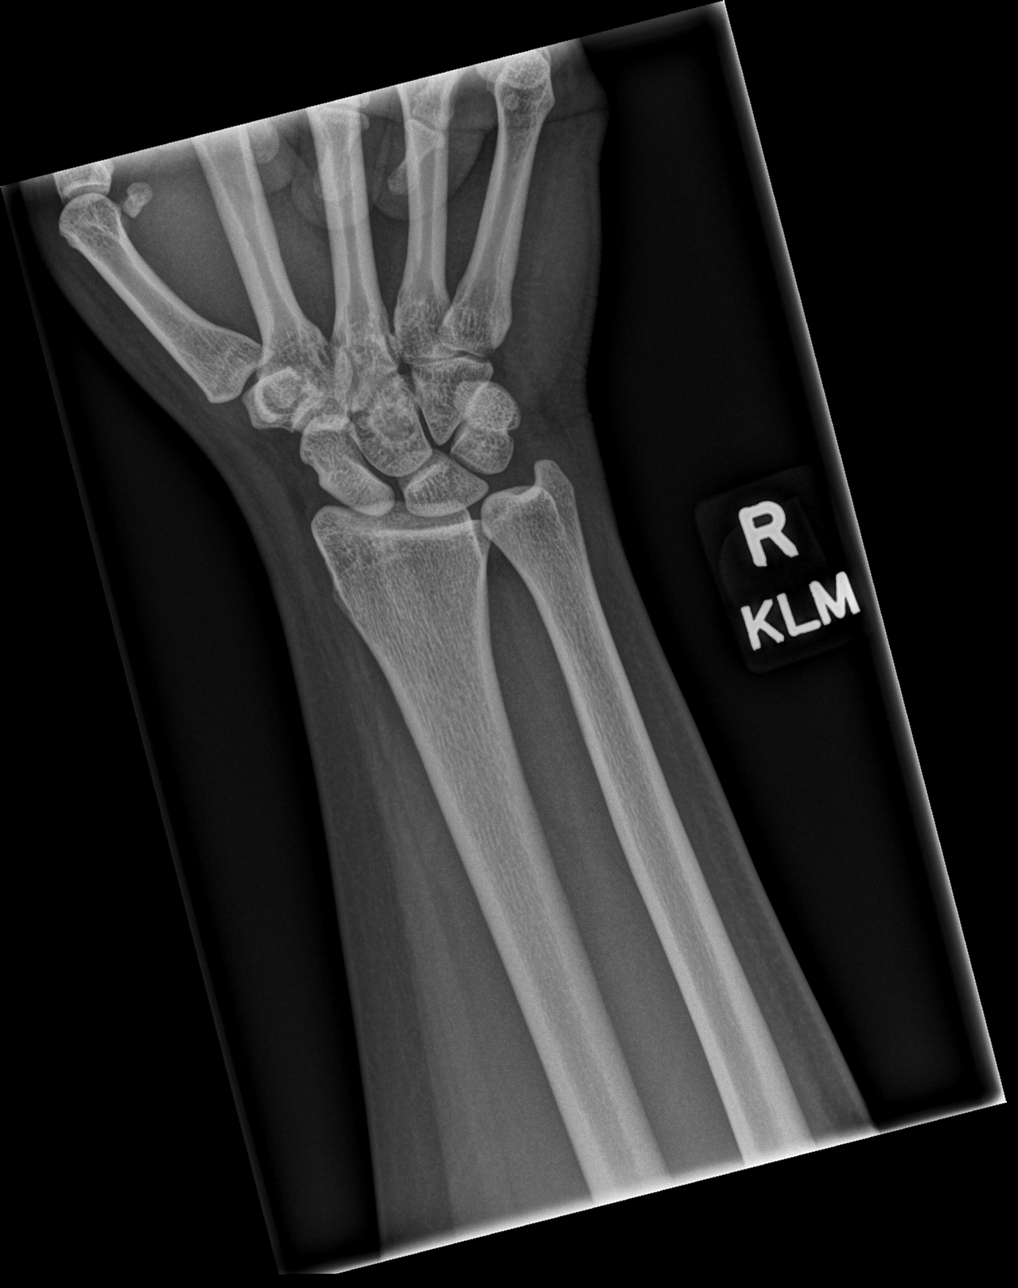

[x wrist obl right]
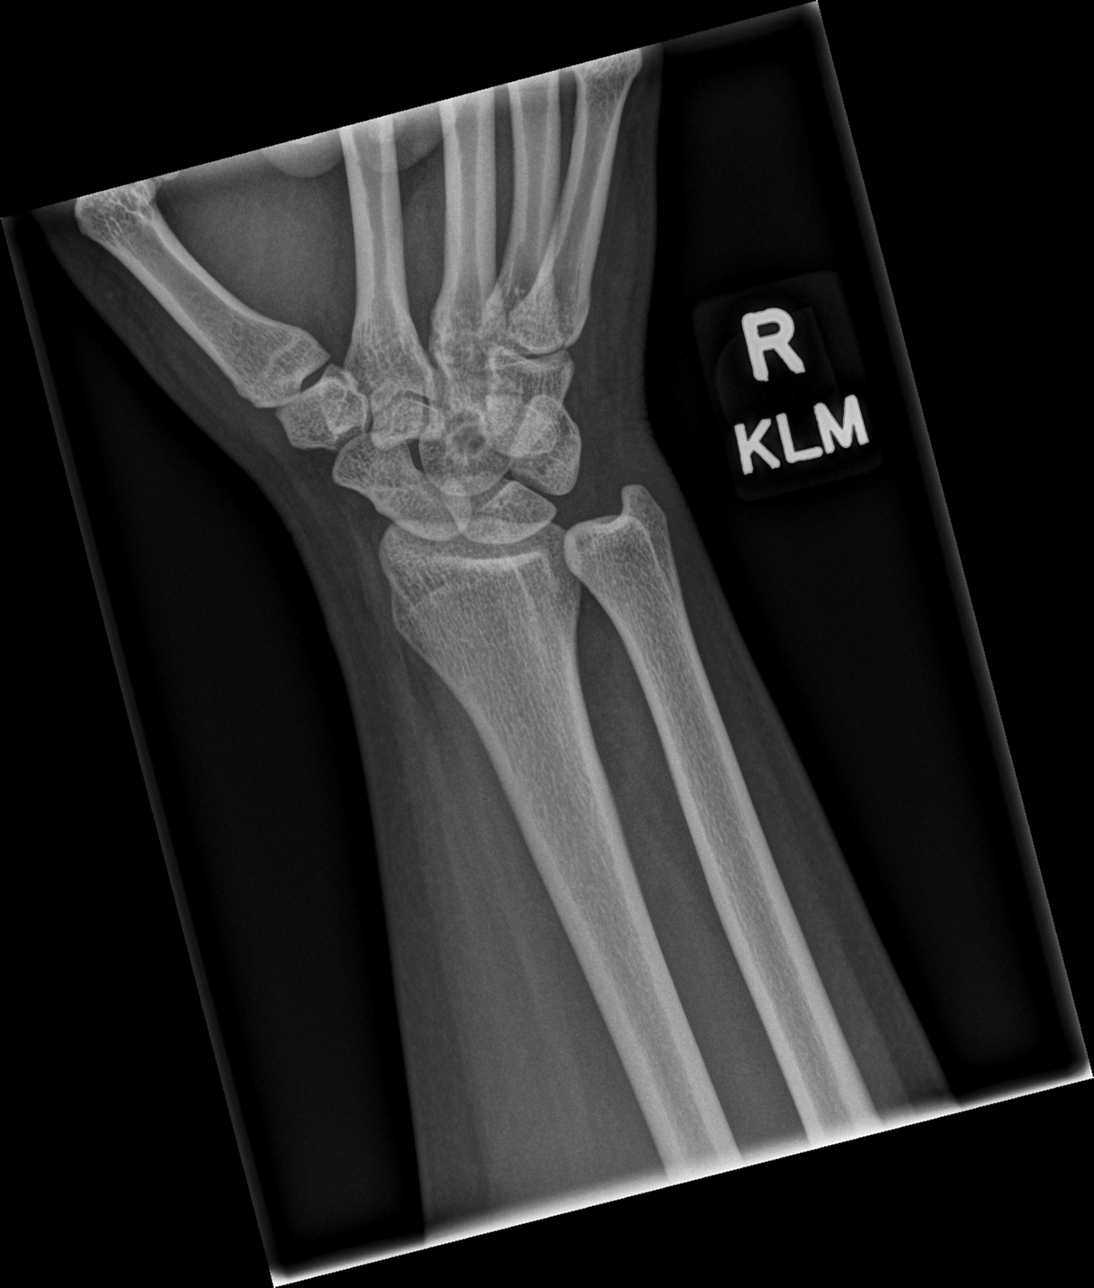

[x wrist lat right]
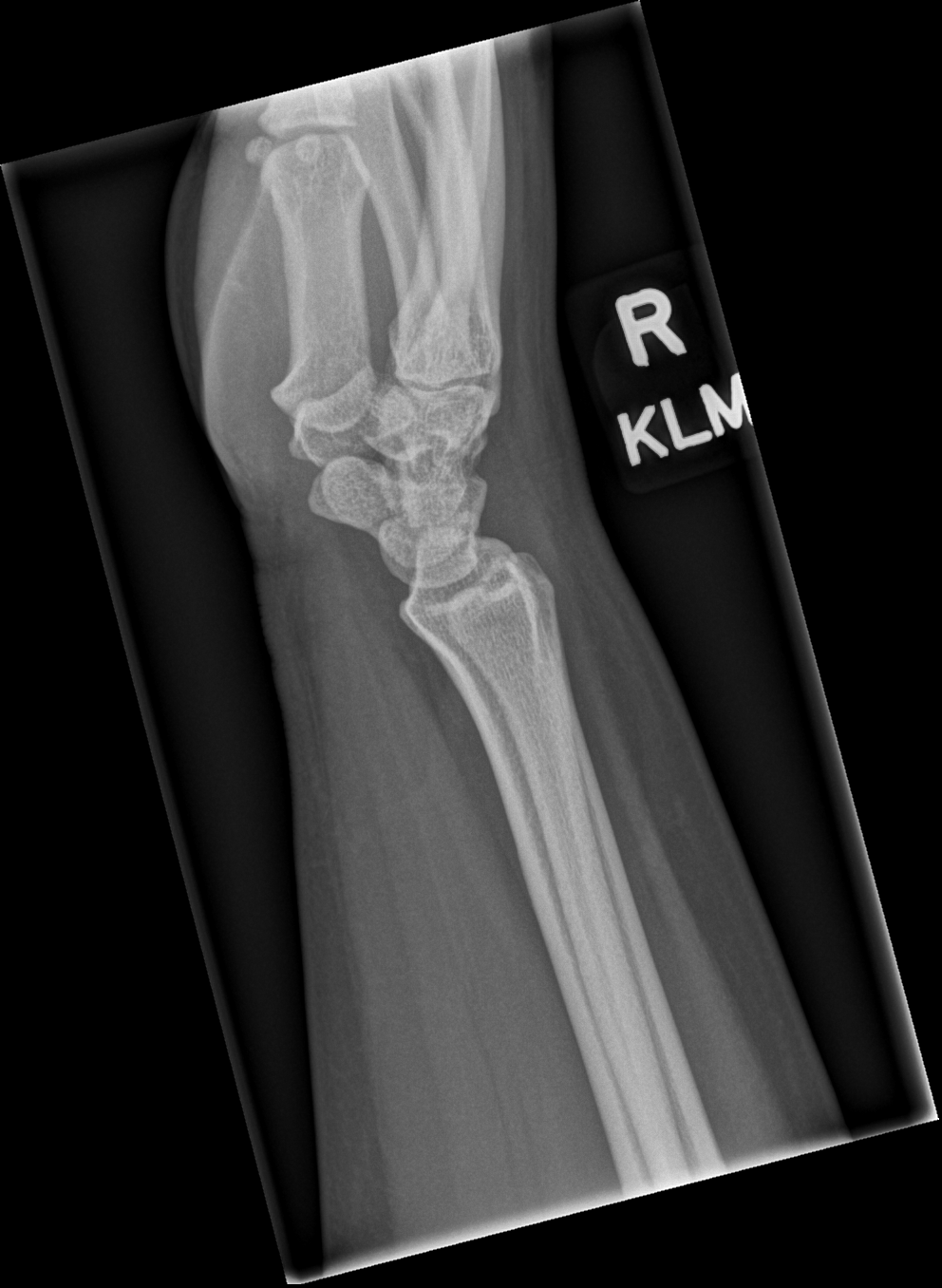

[x wrist navicular view right]
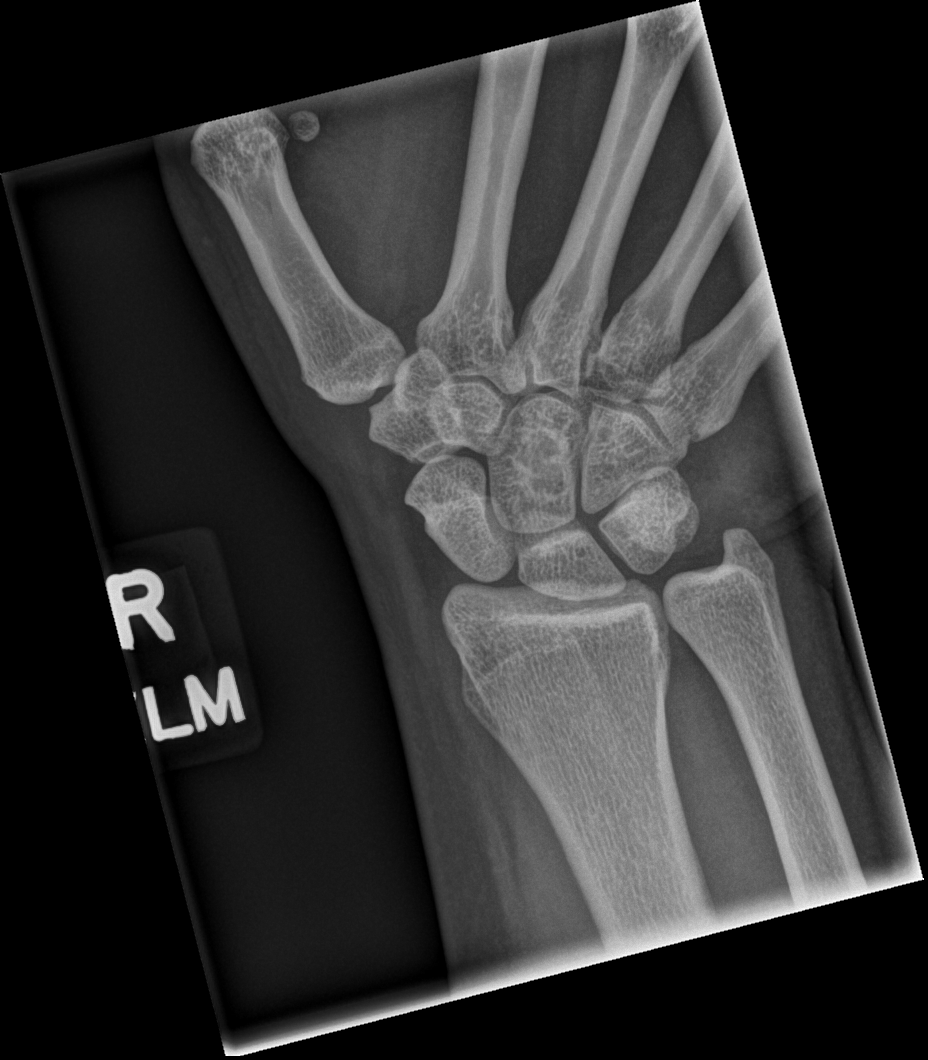

[4 of 4 positions shown; findings below may reference images not displayed]

FINDINGS: There is no evidence of fracture or dislocation. There is no
evidence of arthropathy or other focal bone abnormality. Soft
tissues are unremarkable.
IMPRESSION: Negative.

## 2023-01-28 ENCOUNTER — Ambulatory Visit
Admission: EM | Admit: 2023-01-28 | Discharge: 2023-01-28 | Disposition: A | Discharge status: Home / Self Care | Payer: BC Managed Care – PPO | Attending: Physician Assistant | Admitting: Physician Assistant

## 2023-01-28 DIAGNOSIS — J0120 Acute ethmoidal sinusitis, unspecified: Secondary | ICD-10-CM | POA: Diagnosis not present

## 2023-01-28 MED ORDER — AMOXICILLIN-POT CLAVULANATE 875-125 MG PO TABS: 1.0000 | ORAL_TABLET | Freq: Two times a day (BID) | ORAL | 0 refills | Status: DC

## 2023-01-28 NOTE — ED Provider Notes (Signed)
[facility]    CSN: [id number] Arrival date & time: 01/28/23  1448      History   Chief Complaint Chief Complaint  Patient presents with   Facial Pain    HPI Sandy Smith is a 27 y.o. female.   Patient complains of sinus pressure and congestion.  Patient reports that she has pain behind both cheeks.  Patient feels like her face is puffy.  Patient reports that she has had symptoms for over 2 weeks  The history is provided by the patient. No language interpreter was used.    Past Medical History:  Diagnosis Date   Asthma    Carpal tunnel syndrome     Patient Active Problem List   Diagnosis Date Noted   IUD (intrauterine device) in place 08/31/2017   Major depressive disorder, single episode, severe without psychotic features (HCC) 08/08/2017   Severe recurrent major depression without psychotic features (HCC) 08/08/2017    Past Surgical History:  Procedure Laterality Date   WISDOM TOOTH EXTRACTION      OB History     Gravida  1   Para  1   Term  1   Preterm      AB      Living  1      SAB      IAB      Ectopic      Multiple  0   Live Births  1            Home Medications    Prior to Admission medications   Medication Sig Start Date End Date Taking? Authorizing Provider  amoxicillin-clavulanate (AUGMENTIN) 875-125 MG tablet Take 1 tablet by mouth every 12 (twelve) hours. 01/28/23   Elson Areas, PA-C  benzonatate (TESSALON) 100 MG capsule Take 1 capsule (100 mg total) by mouth every 8 (eight) hours as needed for cough. 07/13/22   Gustavus Bryant, FNP  cetirizine (ZYRTEC ALLERGY) 10 MG tablet Take 1 tablet (10 mg total) by mouth daily. 05/07/21   Wallis Bamberg, PA-C  hydrOXYzine (ATARAX/VISTARIL) 25 MG tablet Take 1 tablet (25 mg total) by mouth 3 (three) times daily as needed for anxiety. Patient not taking: Reported on 01/01/2021 08/12/17   Money, Gerlene Burdock, FNP  ibuprofen (ADVIL,MOTRIN) 400 MG tablet Take 400 mg by mouth every  6 (six) hours as needed.    [provider]  pseudoephedrine (SUDAFED) 60 MG tablet Take 1 tablet (60 mg total) by mouth every 8 (eight) hours as needed for congestion. 05/07/21   Wallis Bamberg, PA-C    Family History History reviewed. No pertinent family history.  Social History Social History   Tobacco Use   Smoking status: Never   Smokeless tobacco: Never  Vaping Use   Vaping Use: Never used  Substance Use Topics   Alcohol use: No   Drug use: No     Allergies   Shellfish allergy   Review of Systems Review of Systems  All other systems reviewed and are negative.    Physical Exam Triage Vital Signs ED Triage Vitals  Enc Vitals Group     BP 01/28/23 1509 137/87     Pulse Rate 01/28/23 1509 89     Resp 01/28/23 1509 18     Temp 01/28/23 1509 98 F (36.7 C)     Temp Source 01/28/23 1509 Oral     SpO2 01/28/23 1509 98 %     Weight --      Height --  Head Circumference --      Peak Flow --      Pain Score 01/28/23 1508 0     Pain Loc --      Pain Edu? --      Excl. in GC? --    No data found.  Updated Vital Signs BP 137/87 (BP Location: Left Arm)   Pulse 89   Temp 98 F (36.7 C) (Oral)   Resp 18   SpO2 98%   Visual Acuity Right Eye Distance:   Left Eye Distance:   Bilateral Distance:    Right Eye Near:   Left Eye Near:    Bilateral Near:     Physical Exam Vitals and nursing note reviewed.  Constitutional:      Appearance: She is well-developed.  HENT:     Head: Normocephalic.     Right Ear: Tympanic membrane normal.     Left Ear: Tympanic membrane normal.     Nose: Nose normal.     Mouth/Throat:     Mouth: Mucous membranes are moist.  Eyes:     Pupils: Pupils are equal, round, and reactive to light.  Cardiovascular:     Rate and Rhythm: Normal rate.  Pulmonary:     Effort: Pulmonary effort is normal.  Abdominal:     General: There is no distension.  Musculoskeletal:        General: Normal range of motion.     Cervical  back: Normal range of motion.  Neurological:     Mental Status: She is alert and oriented to person, place, and time.      UC Treatments / Results  Labs (all labs ordered are listed, but only abnormal results are displayed) Labs Reviewed - No data to display  EKG   Radiology No results found.  Procedures Procedures (including critical care time)  Medications Ordered in UC Medications - No data to display  Initial Impression / Assessment and Plan / UC Course  I have reviewed the triage vital signs and the nursing notes.  Pertinent labs & imaging results that were available during my care of the patient were reviewed by me and considered in my medical decision making (see chart for details).     DM: Patient may have a sinus infection discussed with her options patient will try decongestants I will start her on Augmentin she is advised to follow-up with her primary care physician. Final Clinical Impressions(s) / UC Diagnoses   Final diagnoses:  Acute ethmoidal sinusitis, recurrence not specified     Discharge Instructions      Return if any problems.    ED Prescriptions     Medication Sig Dispense Auth. Provider   amoxicillin-clavulanate (AUGMENTIN) 875-125 MG tablet Take 1 tablet by mouth every 12 (twelve) hours. 14 tablet Elson Areas, [place]      PDMP not reviewed this encounter. An After Visit Summary was printed and given to the patient.    Elson Areas, [place] 01/28/23 1529

## 2023-01-28 NOTE — ED Triage Notes (Signed)
Pt present facial pain around her nose,cheek and eye area with pressure. Symptoms started a few weeks ago.

## 2023-01-28 NOTE — Discharge Instructions (Addendum)
Return if any problems.

## 2023-03-05 DIAGNOSIS — F411 Generalized anxiety disorder: Secondary | ICD-10-CM | POA: Diagnosis not present

## 2023-03-05 DIAGNOSIS — F331 Major depressive disorder, recurrent, moderate: Secondary | ICD-10-CM | POA: Diagnosis not present

## 2023-03-05 DIAGNOSIS — F12229 Cannabis dependence with intoxication, unspecified: Secondary | ICD-10-CM | POA: Diagnosis not present

## 2023-03-05 DIAGNOSIS — F410 Panic disorder [episodic paroxysmal anxiety] without agoraphobia: Secondary | ICD-10-CM | POA: Diagnosis not present

## 2023-03-18 DIAGNOSIS — F12229 Cannabis dependence with intoxication, unspecified: Secondary | ICD-10-CM | POA: Diagnosis not present

## 2023-03-18 DIAGNOSIS — F410 Panic disorder [episodic paroxysmal anxiety] without agoraphobia: Secondary | ICD-10-CM | POA: Diagnosis not present

## 2023-03-18 DIAGNOSIS — F331 Major depressive disorder, recurrent, moderate: Secondary | ICD-10-CM | POA: Diagnosis not present

## 2023-03-18 DIAGNOSIS — F411 Generalized anxiety disorder: Secondary | ICD-10-CM | POA: Diagnosis not present

## 2023-04-08 ENCOUNTER — Ambulatory Visit
Admission: EM | Admit: 2023-04-08 | Discharge: 2023-04-08 | Disposition: A | Discharge status: Home / Self Care | Payer: BC Managed Care – PPO | Attending: Family Medicine | Admitting: Family Medicine

## 2023-04-08 DIAGNOSIS — J039 Acute tonsillitis, unspecified: Secondary | ICD-10-CM

## 2023-04-08 DIAGNOSIS — J029 Acute pharyngitis, unspecified: Secondary | ICD-10-CM

## 2023-04-08 LAB — POCT RAPID STREP A (OFFICE): Rapid Strep A Screen: NEGATIVE

## 2023-04-08 MED ORDER — AMOXICILLIN 500 MG PO CAPS: 500.0000 mg | ORAL_CAPSULE | Freq: Two times a day (BID) | ORAL | 0 refills | Status: AC

## 2023-04-08 NOTE — Discharge Instructions (Addendum)
Treating for suspected strep despite rapid strep being negative.  Treating based on the appearance of your symptoms.  Pleat entire course of medication as discussed.  After 3 days of antibiotic treatment throw away your toothbrush and get a new one to prevent reinfection

## 2023-04-08 NOTE — ED Triage Notes (Signed)
Pt reports sore throat x 3 days. Pain is worse when swallows and talks.

## 2023-04-09 NOTE — ED Provider Notes (Signed)
[facility]    CSN: [id number] Arrival date & time: 04/08/23  1837      History   Chief Complaint Chief Complaint  Patient presents with   Sore Throat    HPI Sandy Smith is a 27 y.o. female.   HPI Patient presents for evaluation of sore throat x 3 days. Pain is exacerbated by talking and swallowing. Patient denies any associated URI symptoms. No known exposure to COVID or Strep.  Past Medical History:  Diagnosis Date   Asthma    Carpal tunnel syndrome     Patient Active Problem List   Diagnosis Date Noted   IUD (intrauterine device) in place 08/31/2017   Major depressive disorder, single episode, severe without psychotic features (HCC) 08/08/2017   Severe recurrent major depression without psychotic features (HCC) 08/08/2017    Past Surgical History:  Procedure Laterality Date   WISDOM TOOTH EXTRACTION      OB History     Gravida  1   Para  1   Term  1   Preterm      AB      Living  1      SAB      IAB      Ectopic      Multiple  0   Live Births  1            Home Medications    Prior to Admission medications   Medication Sig Start Date End Date Taking? Authorizing Provider  amoxicillin (AMOXIL) 500 MG capsule Take 1 capsule (500 mg total) by mouth 2 (two) times daily for 10 days. 04/08/23 04/18/23 Yes Bing Neighbors, NP  buPROPion (WELLBUTRIN XL) 150 MG 24 hr tablet Take 150 mg by mouth daily. 03/05/23  Yes [provider]  benzonatate (TESSALON) 100 MG capsule Take 1 capsule (100 mg total) by mouth every 8 (eight) hours as needed for cough. 07/13/22   Gustavus Bryant, FNP  cetirizine (ZYRTEC ALLERGY) 10 MG tablet Take 1 tablet (10 mg total) by mouth daily. 05/07/21   Wallis Bamberg, PA-C  hydrOXYzine (ATARAX/VISTARIL) 25 MG tablet Take 1 tablet (25 mg total) by mouth 3 (three) times daily as needed for anxiety. Patient not taking: Reported on 01/01/2021 08/12/17   Money, Gerlene Burdock, FNP  ibuprofen (ADVIL,MOTRIN) 400  MG tablet Take 400 mg by mouth every 6 (six) hours as needed.    [provider]  pseudoephedrine (SUDAFED) 60 MG tablet Take 1 tablet (60 mg total) by mouth every 8 (eight) hours as needed for congestion. 05/07/21   Wallis Bamberg, PA-C    Family History History reviewed. No pertinent family history.  Social History Social History   Tobacco Use   Smoking status: Never   Smokeless tobacco: Never  Vaping Use   Vaping status: Never Used  Substance Use Topics   Alcohol use: No   Drug use: No     Allergies   Shellfish allergy   Review of Systems Review of Systems Pertinent negatives listed in HPI   Physical Exam Triage Vital Signs ED Triage Vitals  Encounter Vitals Group     BP 04/08/23 1925 (!) 141/87     Systolic BP Percentile --      Diastolic BP Percentile --      Pulse Rate 04/08/23 1925 87     Resp 04/08/23 1925 18     Temp 04/08/23 1925 99.2 F (37.3 C)     Temp Source 04/08/23 1925 Oral  SpO2 04/08/23 1925 97 %     Weight --      Height --      Head Circumference --      Peak Flow --      Pain Score 04/08/23 1930 6     Pain Loc --      Pain Education --      Exclude from Growth Chart --    No data found.  Updated Vital Signs BP (!) 141/87 (BP Location: Right Arm)   Pulse 87   Temp 99.2 F (37.3 C) (Oral)   Resp 18   LMP  (Within Weeks) Comment: 3 weeks  SpO2 97%   Visual Acuity Right Eye Distance:   Left Eye Distance:   Bilateral Distance:    Right Eye Near:   Left Eye Near:    Bilateral Near:     Physical Exam Vitals reviewed.  Constitutional:      Appearance: She is well-developed. She is ill-appearing.  HENT:     Head: Normocephalic and atraumatic.     Mouth/Throat:     Pharynx: Pharyngeal swelling and uvula swelling present. No oropharyngeal exudate.     Tonsils: 3+ on the right. 3+ on the left.  Eyes:     Conjunctiva/sclera: Conjunctivae normal.     Pupils: Pupils are equal, round, and reactive to light.   Cardiovascular:     Rate and Rhythm: Normal rate and regular rhythm.     Heart sounds:     No gallop.  Pulmonary:     Effort: Pulmonary effort is normal.     Breath sounds: Normal breath sounds.  Lymphadenopathy:     Cervical: Cervical adenopathy present.  Skin:    General: Skin is warm and dry.     Capillary Refill: Capillary refill takes less than 2 seconds.  Neurological:     General: No focal deficit present.     Mental Status: She is alert.  Psychiatric:        Mood and Affect: Mood normal.      UC Treatments / Results  Labs (all labs ordered are listed, but only abnormal results are displayed) Labs Reviewed  POCT RAPID STREP A (OFFICE)    EKG   Radiology No results found.  Procedures Procedures (including critical care time)  Medications Ordered in UC Medications - No data to display  Initial Impression / Assessment and Plan / UC Course  I have reviewed the triage vital signs and the nursing notes.  Pertinent labs & imaging results that were available during my care of the patient were reviewed by me and considered in my medical decision making (see chart for details).    Acute tonsillitis and pharyngitis Rapid strep is negative, covering empirically to cover for bacterial tonsillitis and pharyngitis. Tylenol and or Ibuprofen for fever or pain. Return precaution given. Final Clinical Impressions(s) / UC Diagnoses   Final diagnoses:  Acute tonsillitis, unspecified etiology  Acute pharyngitis, unspecified etiology     Discharge Instructions      Treating for suspected strep despite rapid strep being negative.  Treating based on the appearance of your symptoms.  Pleat entire course of medication as discussed.  After 3 days of antibiotic treatment throw away your toothbrush and get a new one to prevent reinfection   ED Prescriptions     Medication Sig Dispense Auth. Provider   amoxicillin (AMOXIL) 500 MG capsule Take 1 capsule (500 mg total) by  mouth 2 (two) times daily for 10 days.  20 capsule Bing Neighbors, NP      PDMP not reviewed this encounter.   Bing Neighbors, NP 04/12/23 2358

## 2023-04-15 DIAGNOSIS — F411 Generalized anxiety disorder: Secondary | ICD-10-CM | POA: Diagnosis not present

## 2023-04-15 DIAGNOSIS — F12229 Cannabis dependence with intoxication, unspecified: Secondary | ICD-10-CM | POA: Diagnosis not present

## 2023-04-15 DIAGNOSIS — F331 Major depressive disorder, recurrent, moderate: Secondary | ICD-10-CM | POA: Diagnosis not present

## 2023-04-15 DIAGNOSIS — F410 Panic disorder [episodic paroxysmal anxiety] without agoraphobia: Secondary | ICD-10-CM | POA: Diagnosis not present

## 2023-05-25 DIAGNOSIS — Z113 Encounter for screening for infections with a predominantly sexual mode of transmission: Secondary | ICD-10-CM | POA: Diagnosis not present

## 2023-05-25 DIAGNOSIS — N898 Other specified noninflammatory disorders of vagina: Secondary | ICD-10-CM | POA: Diagnosis not present

## 2023-05-25 DIAGNOSIS — Z30430 Encounter for insertion of intrauterine contraceptive device: Secondary | ICD-10-CM | POA: Diagnosis not present

## 2023-06-05 ENCOUNTER — Ambulatory Visit
Admission: EM | Admit: 2023-06-05 | Discharge: 2023-06-05 | Disposition: A | Discharge status: Home / Self Care | Payer: BC Managed Care – PPO | Attending: Family Medicine | Admitting: Family Medicine

## 2023-06-05 DIAGNOSIS — R21 Rash and other nonspecific skin eruption: Secondary | ICD-10-CM | POA: Diagnosis not present

## 2023-06-05 MED ORDER — METHYLPREDNISOLONE ACETATE 80 MG/ML IJ SUSP
60.0000 mg | Freq: Once | INTRAMUSCULAR | Status: AC
Start: 2023-06-05 — End: 2023-06-05
  Administered 2023-06-05: 60 mg via INTRAMUSCULAR

## 2023-06-05 NOTE — ED Triage Notes (Addendum)
Pt c/o rash on face and both hands since this am. Says her palms burn. Also says her feet burn but no visible rash. Does not work around children. Denies her child having any visible rash.

## 2023-06-05 NOTE — ED Provider Notes (Signed)
[facility]    CSN: [id number] Arrival date & time: 06/05/23  1547      History   Chief Complaint Chief Complaint  Patient presents with   Rash    Bilateral hands    HPI Sandy Smith is a 27 y.o. female.   Patient developed a faint rash on her face yesterday.  This morning she developed pruritic erythema of her palms and fingers followed by a burning sensation in her feet without rash.  She recalls no new foods, medications, contact with allergens, plants, etc and feels well otherwise.  The history is provided by the patient.  Rash Location:  Face and hand Quality: itchiness and redness   Quality: not blistering and not swelling   Severity:  Mild Onset quality:  Sudden Duration:  1 day Timing:  Constant Progression:  Worsening Chronicity:  New Context: not animal contact, not chemical exposure, not eggs, not exposure to similar rash, not food, not insect bite/sting, not medications, not new detergent/soap, not plant contact and not sick contacts   Relieved by:  None tried Worsened by:  Nothing Ineffective treatments:  None tried Associated symptoms: no abdominal pain, no diarrhea, no fatigue, no fever, no headaches, no induration, no joint pain, no myalgias, no nausea, no sore throat, no throat swelling and no URI     Past Medical History:  Diagnosis Date   Asthma    Carpal tunnel syndrome     Patient Active Problem List   Diagnosis Date Noted   IUD (intrauterine device) in place 08/31/2017   Major depressive disorder, single episode, severe without psychotic features (HCC) 08/08/2017   Severe recurrent major depression without psychotic features (HCC) 08/08/2017    Past Surgical History:  Procedure Laterality Date   WISDOM TOOTH EXTRACTION      OB History     Gravida  1   Para  1   Term  1   Preterm      AB      Living  1      SAB      IAB      Ectopic      Multiple  0   Live Births  1            Home  Medications    Prior to Admission medications   Medication Sig Start Date End Date Taking? Authorizing Provider  levonorgestrel (MIRENA) 20 MCG/DAY IUD 1 each by Intrauterine route once.   Yes [provider]  buPROPion (WELLBUTRIN XL) 150 MG 24 hr tablet Take 300 mg by mouth daily. 03/05/23   [provider]  cetirizine (ZYRTEC ALLERGY) 10 MG tablet Take 1 tablet (10 mg total) by mouth daily. 05/07/21   Wallis Bamberg, PA-C  hydrOXYzine (ATARAX/VISTARIL) 25 MG tablet Take 1 tablet (25 mg total) by mouth 3 (three) times daily as needed for anxiety. Patient not taking: Reported on 01/01/2021 08/12/17   Money, Gerlene Burdock, FNP  ibuprofen (ADVIL,MOTRIN) 400 MG tablet Take 400 mg by mouth every 6 (six) hours as needed.    [provider]  pseudoephedrine (SUDAFED) 60 MG tablet Take 1 tablet (60 mg total) by mouth every 8 (eight) hours as needed for congestion. 05/07/21   Wallis Bamberg, PA-C    Family History History reviewed. No pertinent family history.  Social History Social History   Tobacco Use   Smoking status: Never   Smokeless tobacco: Never  Vaping Use   Vaping status: Never Used  Substance Use Topics  Alcohol use: No   Drug use: No     Allergies   Shellfish allergy   Review of Systems Review of Systems  Constitutional:  Negative for activity change, appetite change, chills, diaphoresis, fatigue and fever.  HENT:  Negative for sore throat.   Gastrointestinal:  Negative for abdominal pain, diarrhea and nausea.  Musculoskeletal:  Negative for arthralgias and myalgias.  Skin:  Positive for color change and rash.  Neurological:  Negative for headaches.  All other systems reviewed and are negative.    Physical Exam Triage Vital Signs ED Triage Vitals  Encounter Vitals Group     BP 06/05/23 1559 (!) 133/93     Systolic BP Percentile --      Diastolic BP Percentile --      Pulse Rate 06/05/23 1559 (!) 101     Resp 06/05/23 1559 18     Temp 06/05/23  1559 97.9 F (36.6 C)     Temp Source 06/05/23 1559 Oral     SpO2 06/05/23 1559 97 %     Weight --      Height --      Head Circumference --      Peak Flow --      Pain Score 06/05/23 1600 0     Pain Loc --      Pain Education --      Exclude from Growth Chart --    No data found.  Updated Vital Signs BP (!) 133/93 (BP Location: Right Arm)   Pulse (!) 101   Temp 97.9 F (36.6 C) (Oral)   Resp 18   SpO2 97%   Visual Acuity Right Eye Distance:   Left Eye Distance:   Bilateral Distance:    Right Eye Near:   Left Eye Near:    Bilateral Near:     Physical Exam Vitals and nursing note reviewed.  Constitutional:      General: She is not in acute distress. HENT:     Head: Normocephalic.     Mouth/Throat:     Pharynx: Oropharynx is clear.  Eyes:     Pupils: Pupils are equal, round, and reactive to light.  Cardiovascular:     Rate and Rhythm: Tachycardia present.  Pulmonary:     Effort: Pulmonary effort is normal.  Skin:    General: Skin is warm and dry.     Comments: Minimal follicular eruption on forehead.  Both palms of hands are erythematous without swelling.  Neurological:     Mental Status: She is alert.      UC Treatments / Results  Labs (all labs ordered are listed, but only abnormal results are displayed) Labs Reviewed - No data to display  EKG   Radiology No results found.  Procedures Procedures (including critical care time)  Medications Ordered in UC Medications  methylPREDNISolone acetate (DEPO-MEDROL) injection 60 mg (60 mg Intramuscular Given 06/05/23 1645)    Initial Impression / Assessment and Plan / UC Course  I have reviewed the triage vital signs and the nursing notes.  Pertinent labs & imaging results that were available during my care of the patient were reviewed by me and considered in my medical decision making (see chart for details).    ?allergic reaction.  Administered Depo Medrol 60mg  IM. Followup with Family Doctor if  not improved in about 5 days.   Final Clinical Impressions(s) / UC Diagnoses   Final diagnoses:  Rash and nonspecific skin eruption     Discharge Instructions  May take Benadryl at bedtime if needed for itching.    ED Prescriptions   None       Lattie Haw, MD 06/06/23 2056

## 2023-06-05 NOTE — Discharge Instructions (Addendum)
May take Benadryl at bedtime if needed for itching. 

## 2023-06-12 ENCOUNTER — Telehealth: Payer: Self-pay

## 2023-06-12 NOTE — Telephone Encounter (Signed)
Received call from patient for doctor's note

## 2023-06-19 NOTE — Plan of Care (Signed)
 [facility] Tonsillectomy/Adenoidectomy, Postoperative PEDS care plan entered in error.

## 2023-10-16 DIAGNOSIS — Z20822 Contact with and (suspected) exposure to covid-19: Secondary | ICD-10-CM | POA: Diagnosis not present

## 2023-10-16 DIAGNOSIS — J101 Influenza due to other identified influenza virus with other respiratory manifestations: Secondary | ICD-10-CM | POA: Diagnosis not present

## 2023-10-23 DIAGNOSIS — H6693 Otitis media, unspecified, bilateral: Secondary | ICD-10-CM | POA: Diagnosis not present

## 2024-02-02 DIAGNOSIS — M9905 Segmental and somatic dysfunction of pelvic region: Secondary | ICD-10-CM | POA: Diagnosis not present

## 2024-02-02 DIAGNOSIS — M5116 Intervertebral disc disorders with radiculopathy, lumbar region: Secondary | ICD-10-CM | POA: Diagnosis not present

## 2024-02-02 DIAGNOSIS — M25552 Pain in left hip: Secondary | ICD-10-CM | POA: Diagnosis not present

## 2024-02-02 DIAGNOSIS — M9903 Segmental and somatic dysfunction of lumbar region: Secondary | ICD-10-CM | POA: Diagnosis not present

## 2024-09-14 ENCOUNTER — Ambulatory Visit: Admitting: Family Medicine

## 2024-10-05 ENCOUNTER — Ambulatory Visit: Admitting: Family Medicine
